# Patient Record
Sex: Female | Born: 1935 | Race: Black or African American | Hispanic: No | Marital: Married | Smoking: Never smoker
Health system: Southern US, Community
[De-identification: ages and names within clinical notes are randomized; demographics above are authoritative.]

## PROBLEM LIST (undated history)

## (undated) DIAGNOSIS — E119 Type 2 diabetes mellitus without complications: Secondary | ICD-10-CM

## (undated) DIAGNOSIS — C801 Malignant (primary) neoplasm, unspecified: Secondary | ICD-10-CM

## (undated) DIAGNOSIS — E785 Hyperlipidemia, unspecified: Secondary | ICD-10-CM

## (undated) HISTORY — PX: ABDOMINAL HYSTERECTOMY: SHX81

## (undated) HISTORY — PX: BREAST SURGERY: SHX581

---

## 1998-02-18 ENCOUNTER — Other Ambulatory Visit: Admission: RE | Admit: 1998-02-18 | Discharge: 1998-02-18 | Payer: Self-pay | Admitting: Nephrology

## 2001-01-17 ENCOUNTER — Encounter: Admission: RE | Admit: 2001-01-17 | Discharge: 2001-01-17 | Payer: Self-pay | Admitting: Nephrology

## 2001-01-17 ENCOUNTER — Encounter: Payer: Self-pay | Admitting: Nephrology

## 2001-05-30 ENCOUNTER — Other Ambulatory Visit: Admission: RE | Admit: 2001-05-30 | Discharge: 2001-05-30 | Payer: Self-pay | Admitting: Radiology

## 2001-06-14 ENCOUNTER — Encounter (HOSPITAL_BASED_OUTPATIENT_CLINIC_OR_DEPARTMENT_OTHER): Payer: Self-pay | Admitting: General Surgery

## 2001-06-16 ENCOUNTER — Ambulatory Visit (HOSPITAL_COMMUNITY): Admission: RE | Admit: 2001-06-16 | Discharge: 2001-06-16 | Payer: Self-pay | Admitting: General Surgery

## 2001-07-13 ENCOUNTER — Encounter: Admission: RE | Admit: 2001-07-13 | Discharge: 2001-07-13 | Payer: Self-pay | Admitting: Nephrology

## 2001-07-13 ENCOUNTER — Encounter: Payer: Self-pay | Admitting: Nephrology

## 2001-07-19 ENCOUNTER — Ambulatory Visit: Admission: RE | Admit: 2001-07-19 | Discharge: 2001-10-17 | Payer: Self-pay | Admitting: *Deleted

## 2001-12-21 ENCOUNTER — Encounter: Payer: Self-pay | Admitting: Nephrology

## 2001-12-21 ENCOUNTER — Ambulatory Visit (HOSPITAL_COMMUNITY): Admission: RE | Admit: 2001-12-21 | Discharge: 2001-12-21 | Payer: Self-pay | Admitting: Nephrology

## 2002-03-07 ENCOUNTER — Ambulatory Visit (HOSPITAL_COMMUNITY): Admission: RE | Admit: 2002-03-07 | Discharge: 2002-03-07 | Payer: Self-pay | Admitting: Nephrology

## 2002-03-07 ENCOUNTER — Encounter: Payer: Self-pay | Admitting: Nephrology

## 2002-03-08 ENCOUNTER — Other Ambulatory Visit: Admission: RE | Admit: 2002-03-08 | Discharge: 2002-03-08 | Payer: Self-pay | Admitting: Obstetrics and Gynecology

## 2003-09-17 ENCOUNTER — Encounter: Admission: RE | Admit: 2003-09-17 | Discharge: 2003-09-17 | Payer: Self-pay | Admitting: Nephrology

## 2004-07-30 ENCOUNTER — Other Ambulatory Visit: Admission: RE | Admit: 2004-07-30 | Discharge: 2004-07-30 | Payer: Self-pay | Admitting: Nephrology

## 2005-03-30 ENCOUNTER — Ambulatory Visit: Payer: Self-pay | Admitting: Oncology

## 2006-03-31 ENCOUNTER — Ambulatory Visit: Payer: Self-pay | Admitting: Oncology

## 2006-04-01 LAB — CBC WITH DIFFERENTIAL/PLATELET
BASO%: 0.4 % (ref 0.0–2.0)
Eosinophils Absolute: 0.2 10*3/uL (ref 0.0–0.5)
LYMPH%: 22.5 % (ref 14.0–48.0)
MCHC: 34.1 g/dL (ref 32.0–36.0)
MCV: 97.7 fL (ref 81.0–101.0)
MONO#: 0.5 10*3/uL (ref 0.1–0.9)
MONO%: 6.6 % (ref 0.0–13.0)
NEUT#: 4.8 10*3/uL (ref 1.5–6.5)
Platelets: 209 10*3/uL (ref 145–400)
RBC: 3.63 10*6/uL — ABNORMAL LOW (ref 3.70–5.32)
RDW: 12.8 % (ref 11.3–14.5)
WBC: 7.1 10*3/uL (ref 3.9–10.0)

## 2006-04-01 LAB — MORPHOLOGY

## 2006-04-05 LAB — COMPREHENSIVE METABOLIC PANEL
Alkaline Phosphatase: 43 U/L (ref 39–117)
CO2: 24 mEq/L (ref 19–32)
Creatinine, Ser: 1.02 mg/dL (ref 0.40–1.20)
Glucose, Bld: 145 mg/dL — ABNORMAL HIGH (ref 70–99)
Total Bilirubin: 1.1 mg/dL (ref 0.3–1.2)

## 2006-04-05 LAB — IMMUNOFIXATION ELECTROPHORESIS
IgA: 307 mg/dL (ref 68–378)
IgG (Immunoglobin G), Serum: 1420 mg/dL (ref 694–1618)
IgM, Serum: 63 mg/dL (ref 60–263)
Total Protein, Serum Electrophoresis: 7.4 g/dL (ref 6.0–8.3)

## 2006-04-05 LAB — LACTATE DEHYDROGENASE: LDH: 162 U/L (ref 94–250)

## 2006-07-12 ENCOUNTER — Other Ambulatory Visit: Admission: RE | Admit: 2006-07-12 | Discharge: 2006-07-12 | Payer: Self-pay | Admitting: Nephrology

## 2012-02-06 ENCOUNTER — Other Ambulatory Visit: Payer: Self-pay | Admitting: Nephrology

## 2014-09-09 DIAGNOSIS — E119 Type 2 diabetes mellitus without complications: Secondary | ICD-10-CM | POA: Diagnosis not present

## 2014-09-09 DIAGNOSIS — I10 Essential (primary) hypertension: Secondary | ICD-10-CM | POA: Diagnosis not present

## 2014-09-09 DIAGNOSIS — E780 Pure hypercholesterolemia: Secondary | ICD-10-CM | POA: Diagnosis not present

## 2014-09-10 DIAGNOSIS — I10 Essential (primary) hypertension: Secondary | ICD-10-CM | POA: Diagnosis not present

## 2014-09-10 DIAGNOSIS — E780 Pure hypercholesterolemia: Secondary | ICD-10-CM | POA: Diagnosis not present

## 2014-09-10 DIAGNOSIS — E119 Type 2 diabetes mellitus without complications: Secondary | ICD-10-CM | POA: Diagnosis not present

## 2014-09-19 DIAGNOSIS — E780 Pure hypercholesterolemia: Secondary | ICD-10-CM | POA: Diagnosis not present

## 2014-09-19 DIAGNOSIS — I10 Essential (primary) hypertension: Secondary | ICD-10-CM | POA: Diagnosis not present

## 2014-09-19 DIAGNOSIS — E119 Type 2 diabetes mellitus without complications: Secondary | ICD-10-CM | POA: Diagnosis not present

## 2014-09-30 DIAGNOSIS — E119 Type 2 diabetes mellitus without complications: Secondary | ICD-10-CM | POA: Diagnosis not present

## 2014-09-30 DIAGNOSIS — I10 Essential (primary) hypertension: Secondary | ICD-10-CM | POA: Diagnosis not present

## 2014-09-30 DIAGNOSIS — E780 Pure hypercholesterolemia: Secondary | ICD-10-CM | POA: Diagnosis not present

## 2014-10-14 DIAGNOSIS — H4011X3 Primary open-angle glaucoma, severe stage: Secondary | ICD-10-CM | POA: Diagnosis not present

## 2014-10-14 DIAGNOSIS — H02403 Unspecified ptosis of bilateral eyelids: Secondary | ICD-10-CM | POA: Diagnosis not present

## 2014-10-14 DIAGNOSIS — H2512 Age-related nuclear cataract, left eye: Secondary | ICD-10-CM | POA: Diagnosis not present

## 2014-10-21 DIAGNOSIS — Z1231 Encounter for screening mammogram for malignant neoplasm of breast: Secondary | ICD-10-CM | POA: Diagnosis not present

## 2015-01-08 DIAGNOSIS — E119 Type 2 diabetes mellitus without complications: Secondary | ICD-10-CM | POA: Diagnosis not present

## 2015-01-08 DIAGNOSIS — E780 Pure hypercholesterolemia: Secondary | ICD-10-CM | POA: Diagnosis not present

## 2015-01-08 DIAGNOSIS — I10 Essential (primary) hypertension: Secondary | ICD-10-CM | POA: Diagnosis not present

## 2015-02-27 DIAGNOSIS — I10 Essential (primary) hypertension: Secondary | ICD-10-CM | POA: Diagnosis not present

## 2015-02-27 DIAGNOSIS — E119 Type 2 diabetes mellitus without complications: Secondary | ICD-10-CM | POA: Diagnosis not present

## 2015-02-27 DIAGNOSIS — E780 Pure hypercholesterolemia: Secondary | ICD-10-CM | POA: Diagnosis not present

## 2015-02-27 DIAGNOSIS — M542 Cervicalgia: Secondary | ICD-10-CM | POA: Diagnosis not present

## 2015-03-24 DIAGNOSIS — H4011X3 Primary open-angle glaucoma, severe stage: Secondary | ICD-10-CM | POA: Diagnosis not present

## 2015-03-24 DIAGNOSIS — H02403 Unspecified ptosis of bilateral eyelids: Secondary | ICD-10-CM | POA: Diagnosis not present

## 2015-03-26 DIAGNOSIS — E780 Pure hypercholesterolemia: Secondary | ICD-10-CM | POA: Diagnosis not present

## 2015-03-26 DIAGNOSIS — I10 Essential (primary) hypertension: Secondary | ICD-10-CM | POA: Diagnosis not present

## 2015-03-26 DIAGNOSIS — E119 Type 2 diabetes mellitus without complications: Secondary | ICD-10-CM | POA: Diagnosis not present

## 2015-05-07 DIAGNOSIS — E781 Pure hyperglyceridemia: Secondary | ICD-10-CM | POA: Diagnosis not present

## 2015-05-07 DIAGNOSIS — I10 Essential (primary) hypertension: Secondary | ICD-10-CM | POA: Diagnosis not present

## 2015-05-07 DIAGNOSIS — E119 Type 2 diabetes mellitus without complications: Secondary | ICD-10-CM | POA: Diagnosis not present

## 2015-07-25 DIAGNOSIS — H401133 Primary open-angle glaucoma, bilateral, severe stage: Secondary | ICD-10-CM | POA: Diagnosis not present

## 2015-08-20 DIAGNOSIS — E119 Type 2 diabetes mellitus without complications: Secondary | ICD-10-CM | POA: Diagnosis not present

## 2015-08-20 DIAGNOSIS — E7800 Pure hypercholesterolemia, unspecified: Secondary | ICD-10-CM | POA: Diagnosis not present

## 2015-08-20 DIAGNOSIS — I10 Essential (primary) hypertension: Secondary | ICD-10-CM | POA: Diagnosis not present

## 2015-09-13 DIAGNOSIS — H401133 Primary open-angle glaucoma, bilateral, severe stage: Secondary | ICD-10-CM | POA: Diagnosis not present

## 2015-09-19 ENCOUNTER — Emergency Department (HOSPITAL_COMMUNITY)
Admission: EM | Admit: 2015-09-19 | Discharge: 2015-09-19 | Disposition: A | Discharge status: Home / Self Care | Payer: Medicare Other | Attending: Emergency Medicine | Admitting: Emergency Medicine

## 2015-09-19 ENCOUNTER — Emergency Department (HOSPITAL_COMMUNITY): Payer: Medicare Other

## 2015-09-19 ENCOUNTER — Encounter (HOSPITAL_COMMUNITY): Payer: Self-pay | Admitting: *Deleted

## 2015-09-19 DIAGNOSIS — S0993XA Unspecified injury of face, initial encounter: Secondary | ICD-10-CM | POA: Diagnosis present

## 2015-09-19 DIAGNOSIS — E119 Type 2 diabetes mellitus without complications: Secondary | ICD-10-CM | POA: Insufficient documentation

## 2015-09-19 DIAGNOSIS — Z79899 Other long term (current) drug therapy: Secondary | ICD-10-CM | POA: Insufficient documentation

## 2015-09-19 DIAGNOSIS — Y92481 Parking lot as the place of occurrence of the external cause: Secondary | ICD-10-CM | POA: Insufficient documentation

## 2015-09-19 DIAGNOSIS — Y998 Other external cause status: Secondary | ICD-10-CM | POA: Diagnosis not present

## 2015-09-19 DIAGNOSIS — Y9389 Activity, other specified: Secondary | ICD-10-CM | POA: Diagnosis not present

## 2015-09-19 DIAGNOSIS — S0011XA Contusion of right eyelid and periocular area, initial encounter: Secondary | ICD-10-CM | POA: Diagnosis not present

## 2015-09-19 DIAGNOSIS — Z7982 Long term (current) use of aspirin: Secondary | ICD-10-CM | POA: Insufficient documentation

## 2015-09-19 DIAGNOSIS — S0083XA Contusion of other part of head, initial encounter: Secondary | ICD-10-CM | POA: Insufficient documentation

## 2015-09-19 DIAGNOSIS — Z859 Personal history of malignant neoplasm, unspecified: Secondary | ICD-10-CM | POA: Diagnosis not present

## 2015-09-19 DIAGNOSIS — S0033XA Contusion of nose, initial encounter: Secondary | ICD-10-CM | POA: Diagnosis not present

## 2015-09-19 DIAGNOSIS — S0511XA Contusion of eyeball and orbital tissues, right eye, initial encounter: Secondary | ICD-10-CM | POA: Diagnosis not present

## 2015-09-19 DIAGNOSIS — E785 Hyperlipidemia, unspecified: Secondary | ICD-10-CM | POA: Insufficient documentation

## 2015-09-19 DIAGNOSIS — S0990XA Unspecified injury of head, initial encounter: Secondary | ICD-10-CM | POA: Diagnosis not present

## 2015-09-19 DIAGNOSIS — W01198A Fall on same level from slipping, tripping and stumbling with subsequent striking against other object, initial encounter: Secondary | ICD-10-CM | POA: Insufficient documentation

## 2015-09-19 DIAGNOSIS — S0512XA Contusion of eyeball and orbital tissues, left eye, initial encounter: Secondary | ICD-10-CM | POA: Diagnosis not present

## 2015-09-19 HISTORY — DX: Malignant (primary) neoplasm, unspecified: C80.1

## 2015-09-19 HISTORY — DX: Hyperlipidemia, unspecified: E78.5

## 2015-09-19 HISTORY — DX: Type 2 diabetes mellitus without complications: E11.9

## 2015-09-19 MED ORDER — ACETAMINOPHEN 325 MG PO TABS
650.0000 mg | ORAL_TABLET | Freq: Once | ORAL | Status: AC
  Administered 2015-09-19: 650 mg via ORAL
  Filled 2015-09-19: qty 2

## 2015-09-19 MED ORDER — MUPIROCIN CALCIUM 2 % EX CREA: 1.0000 "application " | TOPICAL_CREAM | Freq: Two times a day (BID) | CUTANEOUS | Status: DC

## 2015-09-19 NOTE — ED Notes (Signed)
Pt tripped over parking curb Monday, hit face against sign, had swelling to face, now has bruising to both eyes, No LOC at time. Spoke with PMD and was told to come to ED.

## 2015-09-19 NOTE — ED Provider Notes (Signed)
CSN: [id number]     Arrival date & time 09/19/15  1121 History   First MD Initiated Contact with Patient 09/19/15 1252     Chief Complaint  Patient presents with  . Facial Injury    Patient is a 82 y.o. female presenting with facial injury.  Facial Injury   Joann Anderson is an 82 y.o. female who presents to the ED for evaluation of of bruising of her face after a facial injury. She states that four days ago she tripped over the curb in the parking lot and hit her right forehead on a parking sign. She states that initially she had a "knot" above her right eyebrow. She states that the knot has gone down but over the past couple of days she has developed significant bruising around her right eye and it is starting to spread across the bridge of her nose to her left eye. She denies any headache, dizziness, nausea/vomiting. Denies visual disturbance or pain with eye movement. She states she has been taking ibuprofen as needed for pain that is localized more to the abrasion site. Denies anticoagulation use. Otherwise has no complaints. Denies falling or LOC at time of injury.   Past Medical History  Diagnosis Date  . Diabetes mellitus without complication ([place])   . Hyperlipemia   . Cancer [facility])    Past Surgical History  Procedure Laterality Date  . Breast surgery    . Abdominal hysterectomy     No family history on file. Social History  Substance Use Topics  . Smoking status: Never Smoker   . Smokeless tobacco: None  . Alcohol Use: No   OB History    No data available     Review of Systems  All other systems reviewed and are negative.     Allergies  Aspirin; Quinine; and Sulfamethoxazole  Home Medications   Prior to Admission medications   Medication Sig Start Date End Date Taking? Authorizing Provider  amLODipine (NORVASC) 10 MG tablet Take 10 mg by mouth daily. 07/19/12  Yes Historical Provider, MD  aspirin 81 MG tablet Take 81 mg by mouth daily.   Yes Historical Provider, MD   atorvastatin (LIPITOR) 20 MG tablet Take 20 mg by mouth daily. 12/19/12  Yes Historical Provider, MD  bimatoprost (LUMIGAN) 0.01 % SOLN Place 1 drop into both eyes at bedtime. 03/24/15  Yes Historical Provider, MD  brimonidine (ALPHAGAN P) 0.1 % SOLN Place 1 drop into both eyes 2 (two) times daily. 03/24/15  Yes Historical Provider, MD  dorzolamide-timolol (COSOPT) 22.3-6.8 MG/ML ophthalmic solution Place 1 drop into both eyes 2 (two) times daily. 09/16/15  Yes Historical Provider, MD  HUMULIN N 100 UNIT/ML injection Inject 10-30 Units into the skin 2 (two) times daily. 30 units every morning and 10 units every evening 08/22/15  Yes Historical Provider, MD  pregabalin (LYRICA) 25 MG capsule Take 25 mg by mouth daily. 08/03/12  Yes Historical Provider, MD  valsartan-hydrochlorothiazide (DIOVAN-HCT) 160-12.5 MG tablet Take 1 tablet by mouth daily. 09/01/15  Yes Historical Provider, MD   BP 162/70 mmHg  Pulse 76  Temp(Src) 98.1 F (36.7 C) (Oral)  Resp 18  SpO2 97% Physical Exam  Constitutional: She is oriented to person, place, and time. No distress.  HENT:  Head:    Right Ear: External ear normal.  Left Ear: External ear normal.  Nose: Nose normal.  No crepitus or instability of facial bones. No other facial tenderness.   Eyes: Conjunctivae are normal. No scleral icterus.  Diffuse ecchymosis right periorbital area. There is some bruising that extends across bridge of nose to the inner corner of left periorbital area.   Right eye with some conjunctival hemorrhage to lateral sclera.   EOM intact and nonpainful bilaterally  Neck: Normal range of motion. Neck supple.  Cardiovascular: Normal rate and regular rhythm.   Pulmonary/Chest: Effort normal. No respiratory distress. She exhibits no tenderness.  Abdominal: Soft. She exhibits no distension. There is no tenderness.  Neurological: She is alert and oriented to person, place, and time. No cranial nerve deficit. Coordination and gait normal.    Skin: Skin is warm and dry. She is not diaphoretic.  Psychiatric: She has a normal mood and affect. Her behavior is normal.  Nursing note and vitals reviewed.   ED Course  Procedures (including critical care time) Labs Review Labs Reviewed - No data to display  Imaging Review Ct Head Wo Contrast  09/19/2015  CLINICAL DATA:  Tripped over a curb on Monday striking face against a sign, facial swelling, bruised in both eyes greater on RIGHT, diabetes mellitus EXAM: CT HEAD WITHOUT CONTRAST CT MAXILLOFACIAL WITHOUT CONTRAST TECHNIQUE: Multidetector CT imaging of the head and maxillofacial structures were performed using the standard protocol without intravenous contrast. Multiplanar CT image reconstructions of the maxillofacial structures were also generated. Right side of face marked with BB. COMPARISON:  None. FINDINGS: CT HEAD FINDINGS Generalized atrophy. Slight prominent ventricular system likely related atrophy. Cavum septum pellucidum and vergae. No midline shift or mass effect. Normal appearance of brain parenchyma. No intracranial hemorrhage, mass lesion, or evidence acute infarction. No extra-axial fluid collections. Atherosclerotic calcifications at the carotid siphons. Calvaria intact. CT MAXILLOFACIAL FINDINGS Intraorbital soft tissue planes clear. Small RIGHT supraorbital scalp hematoma. Paranasal sinuses, mastoid air cells, and middle ear cavities clear. Hyperostosis frontalis interna. No facial bone fractures identified. Visualized portion of cervical spine appears grossly intact. Denture plates noted. IMPRESSION: No acute intracranial abnormalities. No acute facial bone abnormalities. Electronically Signed   By: Lavonia Dana M.D.   On: 09/19/2015 14:32   Ct Maxillofacial Wo Cm  09/19/2015  CLINICAL DATA:  Tripped over a curb on Monday striking face against a sign, facial swelling, bruised in both eyes greater on RIGHT, diabetes mellitus EXAM: CT HEAD WITHOUT CONTRAST CT MAXILLOFACIAL  WITHOUT CONTRAST TECHNIQUE: Multidetector CT imaging of the head and maxillofacial structures were performed using the standard protocol without intravenous contrast. Multiplanar CT image reconstructions of the maxillofacial structures were also generated. Right side of face marked with BB. COMPARISON:  None. FINDINGS: CT HEAD FINDINGS Generalized atrophy. Slight prominent ventricular system likely related atrophy. Cavum septum pellucidum and vergae. No midline shift or mass effect. Normal appearance of brain parenchyma. No intracranial hemorrhage, mass lesion, or evidence acute infarction. No extra-axial fluid collections. Atherosclerotic calcifications at the carotid siphons. Calvaria intact. CT MAXILLOFACIAL FINDINGS Intraorbital soft tissue planes clear. Small RIGHT supraorbital scalp hematoma. Paranasal sinuses, mastoid air cells, and middle ear cavities clear. Hyperostosis frontalis interna. No facial bone fractures identified. Visualized portion of cervical spine appears grossly intact. Denture plates noted. IMPRESSION: No acute intracranial abnormalities. No acute facial bone abnormalities. Electronically Signed   By: Lavonia Dana M.D.   On: 09/19/2015 14:32   I have personally reviewed and evaluated these images and lab results as part of my medical decision-making.   EKG Interpretation None      MDM   Final diagnoses:  Contusion of face, initial encounter    CT negative. Pain resolved with tylenol. Will  give rx for bactroban for small abrasion on forehead. Pt may continue tylenol as needed for pain. Instructed to f/u with PCP. ER return precautions given.    Anne Ng, PA-C 09/19/15 1633  Carmin Muskrat, MD 09/22/15 [id number]

## 2015-09-19 NOTE — ED Notes (Signed)
Ambulatory

## 2015-09-19 NOTE — Discharge Instructions (Signed)
Your CT scans were normal today. Your bruises will take time to heal on their own. I will give you a prescription for antibiotic cream to put on the abrasion on your forehead as needed. Please follow up with your primary care provider within one week. Return to the ER for new or worsening symptoms.   Facial or Scalp Contusion A facial or scalp contusion is a deep bruise on the face or head. Injuries to the face and head generally cause a lot of swelling, especially around the eyes. Contusions are the result of an injury that caused bleeding under the skin. The contusion may turn blue, purple, or yellow. Minor injuries will give you a painless contusion, but more severe contusions may stay painful and swollen for a few weeks.  CAUSES  A facial or scalp contusion is caused by a blunt injury or trauma to the face or head area.  SIGNS AND SYMPTOMS   Swelling of the injured area.   Discoloration of the injured area.   Tenderness, soreness, or pain in the injured area.  DIAGNOSIS  The diagnosis can be made by taking a medical history and doing a physical exam. An X-ray exam, CT scan, or MRI may be needed to determine if there are any associated injuries, such as broken bones (fractures). TREATMENT  Often, the best treatment for a facial or scalp contusion is applying cold compresses to the injured area. Over-the-counter medicines may also be recommended for pain control.  HOME CARE INSTRUCTIONS   Only take over-the-counter or prescription medicines as directed by your health care provider.   Apply ice to the injured area.   Put ice in a plastic bag.   Place a towel between your skin and the bag.   Leave the ice on for 20 minutes, 2-3 times a day.  SEEK MEDICAL CARE IF:  You have bite problems.   You have pain with chewing.   You are concerned about facial defects. SEEK IMMEDIATE MEDICAL CARE IF:  You have severe pain or a headache that is not relieved by medicine.   You  have unusual sleepiness, confusion, or personality changes.   You throw up (vomit).   You have a persistent nosebleed.   You have double vision or blurred vision.   You have fluid drainage from your nose or ear.   You have difficulty walking or using your arms or legs.  MAKE SURE YOU:   Understand these instructions.  Will watch your condition.  Will get help right away if you are not doing well or get worse.   This information is not intended to replace advice given to you by your health care provider. Make sure you discuss any questions you have with your health care provider.   Document Released: 07/29/2004 Document Revised: 07/12/2014 Document Reviewed: 02/01/2013 Elsevier Interactive Patient Education [employer].

## 2015-09-24 DIAGNOSIS — S0083XA Contusion of other part of head, initial encounter: Secondary | ICD-10-CM | POA: Diagnosis not present

## 2015-09-24 DIAGNOSIS — E119 Type 2 diabetes mellitus without complications: Secondary | ICD-10-CM | POA: Diagnosis not present

## 2015-09-24 DIAGNOSIS — I10 Essential (primary) hypertension: Secondary | ICD-10-CM | POA: Diagnosis not present

## 2015-09-24 DIAGNOSIS — E7800 Pure hypercholesterolemia, unspecified: Secondary | ICD-10-CM | POA: Diagnosis not present

## 2015-10-15 DIAGNOSIS — J309 Allergic rhinitis, unspecified: Secondary | ICD-10-CM | POA: Diagnosis not present

## 2015-10-15 DIAGNOSIS — E119 Type 2 diabetes mellitus without complications: Secondary | ICD-10-CM | POA: Diagnosis not present

## 2015-10-22 DIAGNOSIS — Z1231 Encounter for screening mammogram for malignant neoplasm of breast: Secondary | ICD-10-CM | POA: Diagnosis not present

## 2015-10-22 DIAGNOSIS — Z853 Personal history of malignant neoplasm of breast: Secondary | ICD-10-CM | POA: Diagnosis not present

## 2015-12-17 DIAGNOSIS — M179 Osteoarthritis of knee, unspecified: Secondary | ICD-10-CM | POA: Diagnosis not present

## 2015-12-17 DIAGNOSIS — E119 Type 2 diabetes mellitus without complications: Secondary | ICD-10-CM | POA: Diagnosis not present

## 2015-12-17 DIAGNOSIS — E7800 Pure hypercholesterolemia, unspecified: Secondary | ICD-10-CM | POA: Diagnosis not present

## 2015-12-17 DIAGNOSIS — I10 Essential (primary) hypertension: Secondary | ICD-10-CM | POA: Diagnosis not present

## 2016-04-05 DIAGNOSIS — H401133 Primary open-angle glaucoma, bilateral, severe stage: Secondary | ICD-10-CM | POA: Diagnosis not present

## 2016-04-14 DIAGNOSIS — I10 Essential (primary) hypertension: Secondary | ICD-10-CM | POA: Diagnosis not present

## 2016-04-14 DIAGNOSIS — E119 Type 2 diabetes mellitus without complications: Secondary | ICD-10-CM | POA: Diagnosis not present

## 2016-04-14 DIAGNOSIS — M2550 Pain in unspecified joint: Secondary | ICD-10-CM | POA: Diagnosis not present

## 2016-04-14 DIAGNOSIS — E7800 Pure hypercholesterolemia, unspecified: Secondary | ICD-10-CM | POA: Diagnosis not present

## 2016-04-14 DIAGNOSIS — E559 Vitamin D deficiency, unspecified: Secondary | ICD-10-CM | POA: Diagnosis not present

## 2016-04-14 DIAGNOSIS — Z23 Encounter for immunization: Secondary | ICD-10-CM | POA: Diagnosis not present

## 2016-06-21 DIAGNOSIS — H401133 Primary open-angle glaucoma, bilateral, severe stage: Secondary | ICD-10-CM | POA: Diagnosis not present

## 2017-02-21 ENCOUNTER — Emergency Department (HOSPITAL_COMMUNITY): Payer: Medicare Other

## 2017-02-21 ENCOUNTER — Emergency Department (HOSPITAL_COMMUNITY)
Admission: EM | Admit: 2017-02-21 | Discharge: 2017-02-21 | Disposition: A | Discharge status: Home / Self Care | Payer: Medicare Other | Attending: Emergency Medicine | Admitting: Emergency Medicine

## 2017-02-21 DIAGNOSIS — Y9301 Activity, walking, marching and hiking: Secondary | ICD-10-CM | POA: Insufficient documentation

## 2017-02-21 DIAGNOSIS — Z794 Long term (current) use of insulin: Secondary | ICD-10-CM | POA: Diagnosis not present

## 2017-02-21 DIAGNOSIS — E119 Type 2 diabetes mellitus without complications: Secondary | ICD-10-CM | POA: Insufficient documentation

## 2017-02-21 DIAGNOSIS — Z859 Personal history of malignant neoplasm, unspecified: Secondary | ICD-10-CM | POA: Diagnosis not present

## 2017-02-21 DIAGNOSIS — Y999 Unspecified external cause status: Secondary | ICD-10-CM | POA: Diagnosis not present

## 2017-02-21 DIAGNOSIS — M25561 Pain in right knee: Secondary | ICD-10-CM | POA: Diagnosis present

## 2017-02-21 DIAGNOSIS — W19XXXA Unspecified fall, initial encounter: Secondary | ICD-10-CM

## 2017-02-21 DIAGNOSIS — Z7982 Long term (current) use of aspirin: Secondary | ICD-10-CM | POA: Insufficient documentation

## 2017-02-21 DIAGNOSIS — W109XXA Fall (on) (from) unspecified stairs and steps, initial encounter: Secondary | ICD-10-CM | POA: Diagnosis not present

## 2017-02-21 DIAGNOSIS — Z79899 Other long term (current) drug therapy: Secondary | ICD-10-CM | POA: Diagnosis not present

## 2017-02-21 DIAGNOSIS — Y929 Unspecified place or not applicable: Secondary | ICD-10-CM | POA: Diagnosis not present

## 2017-02-21 MED ORDER — TRAMADOL HCL 50 MG PO TABS: 50.0000 mg | ORAL_TABLET | Freq: Four times a day (QID) | ORAL | 0 refills | Status: DC | PRN

## 2017-02-21 MED ORDER — TRAMADOL HCL 50 MG PO TABS
50.0000 mg | ORAL_TABLET | Freq: Once | ORAL | Status: AC
  Administered 2017-02-21: 50 mg via ORAL
  Filled 2017-02-21: qty 1

## 2017-02-21 NOTE — ED Provider Notes (Signed)
[facility] DEPT Provider Note   CSN: [id number] Arrival date & time: 02/21/17  1048     History   Chief Complaint Chief Complaint  Patient presents with  . Knee Pain    HPI Joann Anderson is a 83 y.o. female.  The history is provided by the patient, the spouse and medical records.  Knee Pain   This is a new problem. The current episode started more than 2 days ago. The problem occurs constantly. The problem has not changed since onset.The pain is present in the right knee. The quality of the pain is described as aching. The pain is at a severity of 5/10. The pain is moderate. Pertinent negatives include no numbness, full range of motion, no stiffness and no tingling. The symptoms are aggravated by standing. She has tried nothing for the symptoms. The treatment provided no relief. There has been a history of trauma. Family history is significant for no gout.    Past Medical History:  Diagnosis Date  . Cancer ([place])   . Diabetes mellitus without complication ([place])   . Hyperlipemia     There are no active problems to display for this patient.   Past Surgical History:  Procedure Laterality Date  . ABDOMINAL HYSTERECTOMY    . BREAST SURGERY      OB History    No data available       Home Medications    Prior to Admission medications   Medication Sig Start Date End Date Taking? Authorizing Provider  amLODipine (NORVASC) 10 MG tablet Take 10 mg by mouth daily. 07/19/12  Yes [provider]  aspirin 81 MG tablet Take 81 mg by mouth daily.   Yes [provider]  atorvastatin (LIPITOR) 20 MG tablet Take 20 mg by mouth daily. 12/19/12  Yes [provider]  bimatoprost (LUMIGAN) 0.01 % SOLN Place 1 drop into both eyes at bedtime. 03/24/15  Yes [provider]  brimonidine (ALPHAGAN P) 0.1 % SOLN Place 1 drop into both eyes 2 (two) times daily. 03/24/15  Yes [provider]  dorzolamide-timolol (COSOPT) 22.3-6.8 MG/ML ophthalmic  solution Place 1 drop into both eyes 2 (two) times daily. 09/16/15  Yes [provider]  HUMULIN N 100 UNIT/ML injection Inject 10-30 Units into the skin 2 (two) times daily. 30 units every morning and 10 units every evening 08/22/15  Yes [provider]  pregabalin (LYRICA) 25 MG capsule Take 25 mg by mouth daily as needed (pain).  08/03/12  Yes [provider]  timolol (TIMOPTIC) 0.5 % ophthalmic solution Place 1 drop into both eyes at bedtime.  01/31/17  Yes [provider]  valsartan-hydrochlorothiazide (DIOVAN-HCT) 160-12.5 MG tablet Take 1 tablet by mouth daily. 09/01/15  Yes [provider]  mupirocin cream (BACTROBAN) 2 % Apply 1 application topically 2 (two) times daily. Patient not taking: Reported on 02/21/2017 09/19/15   Anne Ng, PA-C    Family History No family history on file.  Social History Social History  Substance Use Topics  . Smoking status: Never Smoker  . Smokeless tobacco: Not on file  . Alcohol use No     Allergies   Aspirin; Quinine; and Sulfamethoxazole   Review of Systems Review of Systems  Constitutional: Negative for chills, diaphoresis, fatigue and fever.  HENT: Negative for congestion.   Respiratory: Negative for cough, chest tightness, shortness of breath, wheezing and stridor.   Cardiovascular: Negative for chest pain, palpitations and leg swelling.  Gastrointestinal: Negative for abdominal distention,  abdominal pain, constipation, diarrhea, nausea and vomiting.  Genitourinary: Negative for flank pain.  Musculoskeletal: Negative for back pain, neck pain, neck stiffness and stiffness.  Skin: Negative for rash and wound.  Neurological: Negative for tingling, light-headedness, numbness and headaches.  Psychiatric/Behavioral: Negative for agitation and confusion.  All other systems reviewed and are negative.    Physical Exam Updated Vital Signs BP (!) 147/70 (BP Location: Left Arm)   Pulse 78   Temp  98.3 F (36.8 C) (Oral)   Resp 15   SpO2 97%   Physical Exam  Constitutional: She is oriented to person, place, and time. She appears well-developed and well-nourished. No distress.  HENT:  Head: Normocephalic.  Mouth/Throat: Oropharynx is clear and moist. No oropharyngeal exudate.  Eyes: Pupils are equal, round, and reactive to light. Conjunctivae and EOM are normal.  Cardiovascular: Normal rate and intact distal pulses.   No murmur heard. Pulmonary/Chest: Effort normal. No stridor. She has no wheezes. She exhibits no tenderness.  Abdominal: Soft. She exhibits no distension. There is no tenderness.  Musculoskeletal: She exhibits tenderness. She exhibits no edema or deformity.       Right knee: She exhibits swelling. She exhibits normal range of motion, no laceration, no erythema, normal patellar mobility and no bony tenderness. Tenderness found. Medial joint line tenderness noted. No lateral joint line, no MCL and no patellar tendon tenderness noted.       Legs: Neurological: She is alert and oriented to person, place, and time. No sensory deficit. She exhibits normal muscle tone.  Skin: Capillary refill takes less than 2 seconds. No rash noted. She is not diaphoretic. No erythema.  Psychiatric: She has a normal mood and affect.  Nursing note and vitals reviewed.    ED Treatments / Results  Labs (all labs ordered are listed, but only abnormal results are displayed) Labs Reviewed - No data to display  EKG  EKG Interpretation None       Radiology Dg Knee Complete 4 Views Right  Result Date: 02/21/2017 CLINICAL DATA:  Fall, right knee pain EXAM: RIGHT KNEE - COMPLETE 4+ VIEW COMPARISON:  None. FINDINGS: Moderate to advanced tricompartment degenerative changes with joint space narrowing and spurring. Chondrocalcinosis. Moderate joint effusion. No acute bony abnormality. Specifically, no fracture, subluxation, or dislocation. Soft tissues are intact. IMPRESSION: Chondrocalcinosis  with moderate advanced osteoarthritis. Moderate joint effusion. Electronically Signed   By: Rolm Baptise M.D.   On: 02/21/2017 11:52    Procedures Procedures (including critical care time)  Medications Ordered in ED Medications  traMADol (ULTRAM) tablet 50 mg (50 mg Oral Given 02/21/17 1332)     Initial Impression / Assessment and Plan / ED Course  I have reviewed the triage vital signs and the nursing notes.  Pertinent labs & imaging results that were available during my care of the patient were reviewed by me and considered in my medical decision making (see chart for details).     Joann Anderson is a 83 y.o. female with past medical history significant for diabetes and hyperlipidemia who presents with right knee pain. Patient reports that she fell one week ago going up some stairs outside. She reports hitting the medial right knee onto the ground. She denied hitting any other locations and no headache or neck pain. She reports the pain is moderate to severe when she tries to walk. She denies any numbness, tingling, or weakness distally to the injury. She denies any history of surgery on the knee. She denies any skin change  or fevers or chills. No overlying infection. She says that she has taken Tylenol without significant pain relief. She denies any history of blood clots.  On exam, patient has some mild tenderness to the medial anterior right knee. No pain with knee range of motion. No significant swelling or erythema. No bruising. Normal sensation and pulses distally. No hip tenderness, abdominal tenderness, or chest tenderness. No other abnormalities on exam.  Pictures were obtained showing no evidence of fracture or dislocation. Osteoarthritis was appreciated.  Suspect soft tissue injury to the knee where she hit the ground. Patient will given dose of tramadol and will be given a prescription to go home with. Patient will be placed into a knee sleeve to help with compression. Patient  instructed to follow-up with PCP in the next several days as well as return precautions for any new or worsened symptoms. Patient understands to be careful not to fall.  Patient has no other questions or concerns and was discharged in good condition.           Final Clinical Impressions(s) / ED Diagnoses   Final diagnoses:  Acute pain of right knee  Fall, initial encounter    New Prescriptions New Prescriptions   TRAMADOL (ULTRAM) 50 MG TABLET    Take 1 tablet (50 mg total) by mouth every 6 (six) hours as needed.   Clinical Impression: 1. Acute pain of right knee   2. Fall, initial encounter     Disposition: Discharge  Condition: Good  I have discussed the results, Dx and Tx plan with the pt(& family if present). He/she/they expressed understanding and agree(s) with the plan. Discharge instructions discussed at great length. Strict return precautions discussed and pt &/or family have verbalized understanding of the instructions. No further questions at time of discharge.    New Prescriptions   TRAMADOL (ULTRAM) 50 MG TABLET    Take 1 tablet (50 mg total) by mouth every 6 (six) hours as needed.    Follow Up: [facility] DEPT [id number] [place] [address] [phone]  If symptoms worsen     Tegeler, Gwenyth Allegra, MD 02/21/17 2050

## 2017-02-21 NOTE — Discharge Instructions (Signed)
We suspect to have a musculoskeletal pain from your fall in your knee. Your images today show no evidence of fracture or dislocation. I do not feel you have an infection in your knee. Please use the pain medicine to help with your symptoms and use the rest, ice, compression, and elevation strategy we discussed. Please be careful not to fall again. Please follow-up with your primary care physician for reassessment. If any symptoms change or worsen, please return to the nearest emergency department.

## 2017-02-21 NOTE — ED Triage Notes (Signed)
Pt states that she fell approx. A week ago going up the stairs and hit her R knee. Swelling noted. Alert and oriented.

## 2017-10-03 DIAGNOSIS — H401133 Primary open-angle glaucoma, bilateral, severe stage: Secondary | ICD-10-CM | POA: Diagnosis not present

## 2017-10-24 DIAGNOSIS — Z853 Personal history of malignant neoplasm of breast: Secondary | ICD-10-CM | POA: Diagnosis not present

## 2017-10-24 DIAGNOSIS — Z1231 Encounter for screening mammogram for malignant neoplasm of breast: Secondary | ICD-10-CM | POA: Diagnosis not present

## 2017-10-27 DIAGNOSIS — N6002 Solitary cyst of left breast: Secondary | ICD-10-CM | POA: Diagnosis not present

## 2017-10-27 DIAGNOSIS — Z853 Personal history of malignant neoplasm of breast: Secondary | ICD-10-CM | POA: Diagnosis not present

## 2017-10-27 DIAGNOSIS — R928 Other abnormal and inconclusive findings on diagnostic imaging of breast: Secondary | ICD-10-CM | POA: Diagnosis not present

## 2017-11-09 DIAGNOSIS — M25561 Pain in right knee: Secondary | ICD-10-CM | POA: Diagnosis not present

## 2018-01-02 DIAGNOSIS — H401133 Primary open-angle glaucoma, bilateral, severe stage: Secondary | ICD-10-CM | POA: Diagnosis not present

## 2018-01-11 DIAGNOSIS — Z794 Long term (current) use of insulin: Secondary | ICD-10-CM | POA: Diagnosis not present

## 2018-01-11 DIAGNOSIS — E1165 Type 2 diabetes mellitus with hyperglycemia: Secondary | ICD-10-CM | POA: Diagnosis not present

## 2018-01-11 DIAGNOSIS — E1142 Type 2 diabetes mellitus with diabetic polyneuropathy: Secondary | ICD-10-CM | POA: Diagnosis not present

## 2018-01-11 DIAGNOSIS — I10 Essential (primary) hypertension: Secondary | ICD-10-CM | POA: Diagnosis not present

## 2018-01-11 DIAGNOSIS — E782 Mixed hyperlipidemia: Secondary | ICD-10-CM | POA: Diagnosis not present

## 2018-03-22 DIAGNOSIS — E782 Mixed hyperlipidemia: Secondary | ICD-10-CM | POA: Diagnosis not present

## 2018-03-22 DIAGNOSIS — Z0000 Encounter for general adult medical examination without abnormal findings: Secondary | ICD-10-CM | POA: Diagnosis not present

## 2018-03-22 DIAGNOSIS — I10 Essential (primary) hypertension: Secondary | ICD-10-CM | POA: Diagnosis not present

## 2018-03-22 DIAGNOSIS — E1165 Type 2 diabetes mellitus with hyperglycemia: Secondary | ICD-10-CM | POA: Diagnosis not present

## 2018-03-31 DIAGNOSIS — E1165 Type 2 diabetes mellitus with hyperglycemia: Secondary | ICD-10-CM | POA: Diagnosis not present

## 2018-03-31 DIAGNOSIS — Z0000 Encounter for general adult medical examination without abnormal findings: Secondary | ICD-10-CM | POA: Diagnosis not present

## 2018-03-31 DIAGNOSIS — Z23 Encounter for immunization: Secondary | ICD-10-CM | POA: Diagnosis not present

## 2018-03-31 DIAGNOSIS — E782 Mixed hyperlipidemia: Secondary | ICD-10-CM | POA: Diagnosis not present

## 2018-03-31 DIAGNOSIS — Z794 Long term (current) use of insulin: Secondary | ICD-10-CM | POA: Diagnosis not present

## 2018-04-17 ENCOUNTER — Other Ambulatory Visit: Payer: Self-pay | Admitting: Internal Medicine

## 2018-04-21 DIAGNOSIS — E1165 Type 2 diabetes mellitus with hyperglycemia: Secondary | ICD-10-CM | POA: Diagnosis not present

## 2018-04-21 DIAGNOSIS — E782 Mixed hyperlipidemia: Secondary | ICD-10-CM | POA: Diagnosis not present

## 2018-04-21 DIAGNOSIS — E1142 Type 2 diabetes mellitus with diabetic polyneuropathy: Secondary | ICD-10-CM | POA: Diagnosis not present

## 2018-04-21 DIAGNOSIS — I10 Essential (primary) hypertension: Secondary | ICD-10-CM | POA: Diagnosis not present

## 2018-05-31 DIAGNOSIS — H401133 Primary open-angle glaucoma, bilateral, severe stage: Secondary | ICD-10-CM | POA: Diagnosis not present

## 2018-06-12 DIAGNOSIS — Z0110 Encounter for examination of ears and hearing without abnormal findings: Secondary | ICD-10-CM | POA: Diagnosis not present

## 2018-06-12 DIAGNOSIS — Z131 Encounter for screening for diabetes mellitus: Secondary | ICD-10-CM | POA: Diagnosis not present

## 2018-06-12 DIAGNOSIS — Z0000 Encounter for general adult medical examination without abnormal findings: Secondary | ICD-10-CM | POA: Diagnosis not present

## 2018-06-12 DIAGNOSIS — Z0100 Encounter for examination of eyes and vision without abnormal findings: Secondary | ICD-10-CM | POA: Diagnosis not present

## 2018-06-12 DIAGNOSIS — I10 Essential (primary) hypertension: Secondary | ICD-10-CM | POA: Diagnosis not present

## 2018-06-12 DIAGNOSIS — Z136 Encounter for screening for cardiovascular disorders: Secondary | ICD-10-CM | POA: Diagnosis not present

## 2018-06-16 DIAGNOSIS — M1711 Unilateral primary osteoarthritis, right knee: Secondary | ICD-10-CM | POA: Diagnosis not present

## 2018-07-25 DIAGNOSIS — I10 Essential (primary) hypertension: Secondary | ICD-10-CM | POA: Diagnosis not present

## 2018-07-25 DIAGNOSIS — R609 Edema, unspecified: Secondary | ICD-10-CM | POA: Diagnosis not present

## 2018-07-25 DIAGNOSIS — Z794 Long term (current) use of insulin: Secondary | ICD-10-CM | POA: Diagnosis not present

## 2018-07-25 DIAGNOSIS — E7800 Pure hypercholesterolemia, unspecified: Secondary | ICD-10-CM | POA: Diagnosis not present

## 2018-07-25 DIAGNOSIS — E1165 Type 2 diabetes mellitus with hyperglycemia: Secondary | ICD-10-CM | POA: Diagnosis not present

## 2018-07-28 DIAGNOSIS — I10 Essential (primary) hypertension: Secondary | ICD-10-CM | POA: Diagnosis not present

## 2018-09-07 DIAGNOSIS — I10 Essential (primary) hypertension: Secondary | ICD-10-CM | POA: Diagnosis not present

## 2018-09-07 DIAGNOSIS — E7800 Pure hypercholesterolemia, unspecified: Secondary | ICD-10-CM | POA: Diagnosis not present

## 2018-09-07 DIAGNOSIS — G4709 Other insomnia: Secondary | ICD-10-CM | POA: Diagnosis not present

## 2018-09-07 DIAGNOSIS — E1165 Type 2 diabetes mellitus with hyperglycemia: Secondary | ICD-10-CM | POA: Diagnosis not present

## 2018-09-26 DIAGNOSIS — R609 Edema, unspecified: Secondary | ICD-10-CM | POA: Diagnosis not present

## 2018-09-26 DIAGNOSIS — I10 Essential (primary) hypertension: Secondary | ICD-10-CM | POA: Diagnosis not present

## 2018-09-26 DIAGNOSIS — E1165 Type 2 diabetes mellitus with hyperglycemia: Secondary | ICD-10-CM | POA: Diagnosis not present

## 2018-09-26 DIAGNOSIS — Z794 Long term (current) use of insulin: Secondary | ICD-10-CM | POA: Diagnosis not present

## 2018-09-26 DIAGNOSIS — E7800 Pure hypercholesterolemia, unspecified: Secondary | ICD-10-CM | POA: Diagnosis not present

## 2018-11-20 DIAGNOSIS — H401133 Primary open-angle glaucoma, bilateral, severe stage: Secondary | ICD-10-CM | POA: Diagnosis not present

## 2018-12-15 DIAGNOSIS — Z853 Personal history of malignant neoplasm of breast: Secondary | ICD-10-CM | POA: Diagnosis not present

## 2018-12-15 DIAGNOSIS — Z1231 Encounter for screening mammogram for malignant neoplasm of breast: Secondary | ICD-10-CM | POA: Diagnosis not present

## 2018-12-19 DIAGNOSIS — E1165 Type 2 diabetes mellitus with hyperglycemia: Secondary | ICD-10-CM | POA: Diagnosis not present

## 2018-12-19 DIAGNOSIS — I10 Essential (primary) hypertension: Secondary | ICD-10-CM | POA: Diagnosis not present

## 2018-12-19 DIAGNOSIS — Z794 Long term (current) use of insulin: Secondary | ICD-10-CM | POA: Diagnosis not present

## 2018-12-19 DIAGNOSIS — E7800 Pure hypercholesterolemia, unspecified: Secondary | ICD-10-CM | POA: Diagnosis not present

## 2018-12-19 DIAGNOSIS — R609 Edema, unspecified: Secondary | ICD-10-CM | POA: Diagnosis not present

## 2018-12-27 DIAGNOSIS — H401133 Primary open-angle glaucoma, bilateral, severe stage: Secondary | ICD-10-CM | POA: Diagnosis not present

## 2019-01-26 DIAGNOSIS — Z7189 Other specified counseling: Secondary | ICD-10-CM | POA: Diagnosis not present

## 2019-01-26 DIAGNOSIS — Z03818 Encounter for observation for suspected exposure to other biological agents ruled out: Secondary | ICD-10-CM | POA: Diagnosis not present

## 2019-01-30 DIAGNOSIS — E1165 Type 2 diabetes mellitus with hyperglycemia: Secondary | ICD-10-CM | POA: Diagnosis not present

## 2019-01-30 DIAGNOSIS — Z794 Long term (current) use of insulin: Secondary | ICD-10-CM | POA: Diagnosis not present

## 2019-01-30 DIAGNOSIS — I10 Essential (primary) hypertension: Secondary | ICD-10-CM | POA: Diagnosis not present

## 2019-01-30 DIAGNOSIS — Z0001 Encounter for general adult medical examination with abnormal findings: Secondary | ICD-10-CM | POA: Diagnosis not present

## 2019-01-30 DIAGNOSIS — E1142 Type 2 diabetes mellitus with diabetic polyneuropathy: Secondary | ICD-10-CM | POA: Diagnosis not present

## 2019-03-01 DIAGNOSIS — Z01118 Encounter for examination of ears and hearing with other abnormal findings: Secondary | ICD-10-CM | POA: Diagnosis not present

## 2019-03-01 DIAGNOSIS — Z01021 Encounter for examination of eyes and vision following failed vision screening with abnormal findings: Secondary | ICD-10-CM | POA: Diagnosis not present

## 2019-03-01 DIAGNOSIS — Z1329 Encounter for screening for other suspected endocrine disorder: Secondary | ICD-10-CM | POA: Diagnosis not present

## 2019-03-01 DIAGNOSIS — Z5181 Encounter for therapeutic drug level monitoring: Secondary | ICD-10-CM | POA: Diagnosis not present

## 2019-03-01 DIAGNOSIS — Z1389 Encounter for screening for other disorder: Secondary | ICD-10-CM | POA: Diagnosis not present

## 2019-03-01 DIAGNOSIS — I10 Essential (primary) hypertension: Secondary | ICD-10-CM | POA: Diagnosis not present

## 2019-03-01 DIAGNOSIS — Z136 Encounter for screening for cardiovascular disorders: Secondary | ICD-10-CM | POA: Diagnosis not present

## 2019-03-01 DIAGNOSIS — Z131 Encounter for screening for diabetes mellitus: Secondary | ICD-10-CM | POA: Diagnosis not present

## 2019-03-01 DIAGNOSIS — Z0001 Encounter for general adult medical examination with abnormal findings: Secondary | ICD-10-CM | POA: Diagnosis not present

## 2019-03-01 DIAGNOSIS — E1165 Type 2 diabetes mellitus with hyperglycemia: Secondary | ICD-10-CM | POA: Diagnosis not present

## 2019-04-09 DIAGNOSIS — H401133 Primary open-angle glaucoma, bilateral, severe stage: Secondary | ICD-10-CM | POA: Diagnosis not present

## 2019-05-01 DIAGNOSIS — Z794 Long term (current) use of insulin: Secondary | ICD-10-CM | POA: Diagnosis not present

## 2019-05-01 DIAGNOSIS — I10 Essential (primary) hypertension: Secondary | ICD-10-CM | POA: Diagnosis not present

## 2019-05-01 DIAGNOSIS — E1165 Type 2 diabetes mellitus with hyperglycemia: Secondary | ICD-10-CM | POA: Diagnosis not present

## 2019-05-01 DIAGNOSIS — E7800 Pure hypercholesterolemia, unspecified: Secondary | ICD-10-CM | POA: Diagnosis not present

## 2019-05-01 DIAGNOSIS — E1142 Type 2 diabetes mellitus with diabetic polyneuropathy: Secondary | ICD-10-CM | POA: Diagnosis not present

## 2019-05-16 ENCOUNTER — Other Ambulatory Visit: Payer: Self-pay

## 2019-05-16 ENCOUNTER — Encounter: Payer: Self-pay | Admitting: Podiatry

## 2019-05-16 ENCOUNTER — Ambulatory Visit: Payer: Medicare Other | Admitting: Podiatry

## 2019-05-16 VITALS — BP 161/77 | HR 80

## 2019-05-16 DIAGNOSIS — L84 Corns and callosities: Secondary | ICD-10-CM

## 2019-05-16 DIAGNOSIS — M79675 Pain in left toe(s): Secondary | ICD-10-CM

## 2019-05-16 DIAGNOSIS — Z794 Long term (current) use of insulin: Secondary | ICD-10-CM | POA: Diagnosis not present

## 2019-05-16 DIAGNOSIS — B351 Tinea unguium: Secondary | ICD-10-CM | POA: Diagnosis not present

## 2019-05-16 DIAGNOSIS — I872 Venous insufficiency (chronic) (peripheral): Secondary | ICD-10-CM

## 2019-05-16 DIAGNOSIS — M79674 Pain in right toe(s): Secondary | ICD-10-CM

## 2019-05-16 DIAGNOSIS — E119 Type 2 diabetes mellitus without complications: Secondary | ICD-10-CM | POA: Diagnosis not present

## 2019-05-16 DIAGNOSIS — Z0189 Encounter for other specified special examinations: Secondary | ICD-10-CM

## 2019-05-16 NOTE — Patient Instructions (Addendum)
Compression Stockings/Socks  [facility] [address] [place], [place]  [address]  [phone] Call for an appointment  Diabetes Mellitus and Foot Care Foot care is an important part of your health, especially when you have diabetes. Diabetes may cause you to have problems because of poor blood flow (circulation) to your feet and legs, which can cause your skin to:  Become thinner and drier.  Break more easily.  Heal more slowly.  Peel and crack. You may also have nerve damage (neuropathy) in your legs and feet, causing decreased feeling in them. This means that you may not notice minor injuries to your feet that could lead to more serious problems. Noticing and addressing any potential problems early is the best way to prevent future foot problems. How to care for your feet Foot hygiene  Wash your feet daily with warm water and mild soap. Do not use hot water. Then, pat your feet and the areas between your toes until they are completely dry. Do not soak your feet as this can dry your skin.  Trim your toenails straight across. Do not dig under them or around the cuticle. File the edges of your nails with an emery board or nail file.  Apply a moisturizing lotion or petroleum jelly to the skin on your feet and to dry, brittle toenails. Use lotion that does not contain alcohol and is unscented. Do not apply lotion between your toes. Shoes and socks  Wear clean socks or stockings every day. Make sure they are not too tight. Do not wear knee-high stockings since they may decrease blood flow to your legs.  Wear shoes that fit properly and have enough cushioning. Always look in your shoes before you put them on to be sure there are no objects inside.  To break in new shoes, wear them for just a few hours a day. This prevents injuries on your feet. Wounds, scrapes, corns, and calluses  Check your feet daily for blisters, cuts, bruises, sores, and redness. If you cannot see  the bottom of your feet, use a mirror or ask someone for help.  Do not cut corns or calluses or try to remove them with medicine.  If you find a minor scrape, cut, or break in the skin on your feet, keep it and the skin around it clean and dry. You may clean these areas with mild soap and water. Do not clean the area with peroxide, alcohol, or iodine.  If you have a wound, scrape, corn, or callus on your foot, look at it several times a day to make sure it is healing and not infected. Check for: ? Redness, swelling, or pain. ? Fluid or blood. ? Warmth. ? Pus or a bad smell. General instructions  Do not cross your legs. This may decrease blood flow to your feet.  Do not use heating pads or hot water bottles on your feet. They may burn your skin. If you have lost feeling in your feet or legs, you may not know this is happening until it is too late.  Protect your feet from hot and cold by wearing shoes, such as at the beach or on hot pavement.  Schedule a complete foot exam at least once a year (annually) or more often if you have foot problems. If you have foot problems, report any cuts, sores, or bruises to your health care provider immediately. Contact a health care provider if:  You have a medical condition that increases your risk   of infection and you have any cuts, sores, or bruises on your feet.  You have an injury that is not healing.  You have redness on your legs or feet.  You feel burning or tingling in your legs or feet.  You have pain or cramps in your legs and feet.  Your legs or feet are numb.  Your feet always feel cold.  You have pain around a toenail. Get help right away if:  You have a wound, scrape, corn, or callus on your foot and: ? You have pain, swelling, or redness that gets worse. ? You have fluid or blood coming from the wound, scrape, corn, or callus. ? Your wound, scrape, corn, or callus feels warm to the touch. ? You have pus or a bad smell coming  from the wound, scrape, corn, or callus. ? You have a fever. ? You have a red line going up your leg. Summary  Check your feet every day for cuts, sores, red spots, swelling, and blisters.  Moisturize feet and legs daily.  Wear shoes that fit properly and have enough cushioning.  If you have foot problems, report any cuts, sores, or bruises to your health care provider immediately.  Schedule a complete foot exam at least once a year (annually) or more often if you have foot problems. This information is not intended to replace advice given to you by your health care provider. Make sure you discuss any questions you have with your health care provider. Document Released: 06/18/2000 Document Revised: 08/03/2017 Document Reviewed: 07/23/2016 Elsevier Patient Education  2020 [employer]  

## 2019-05-19 IMAGING — CR DG KNEE COMPLETE 4+V*R*
4 series · 4 of 4 positions shown · non-contrast
Comparison: None.

CLINICAL DATA: Fall, right knee pain

EXAM:
RIGHT KNEE - COMPLETE 4+ VIEW

[t knee ap right]
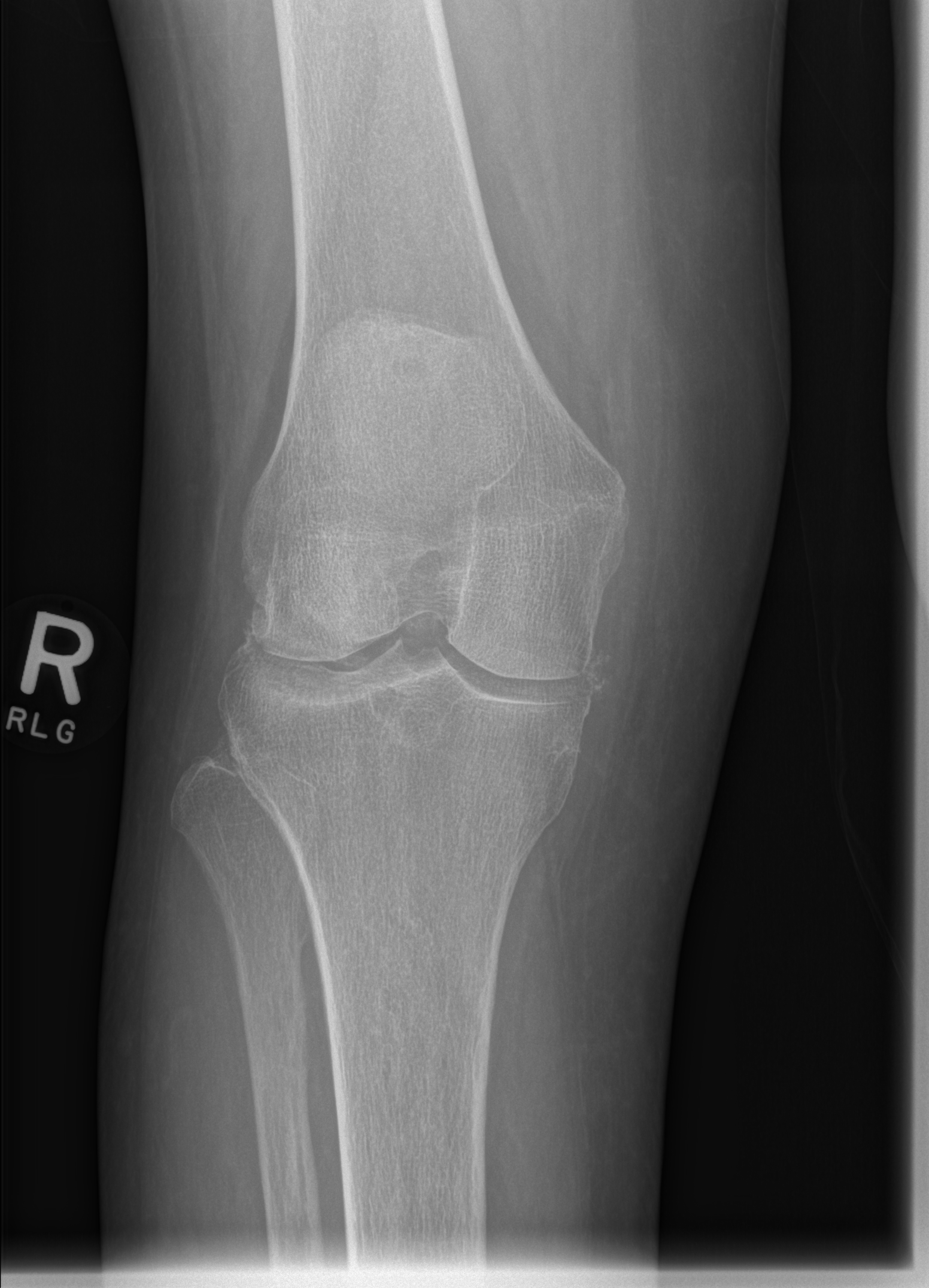

[t knee obl right (1 of 2)]
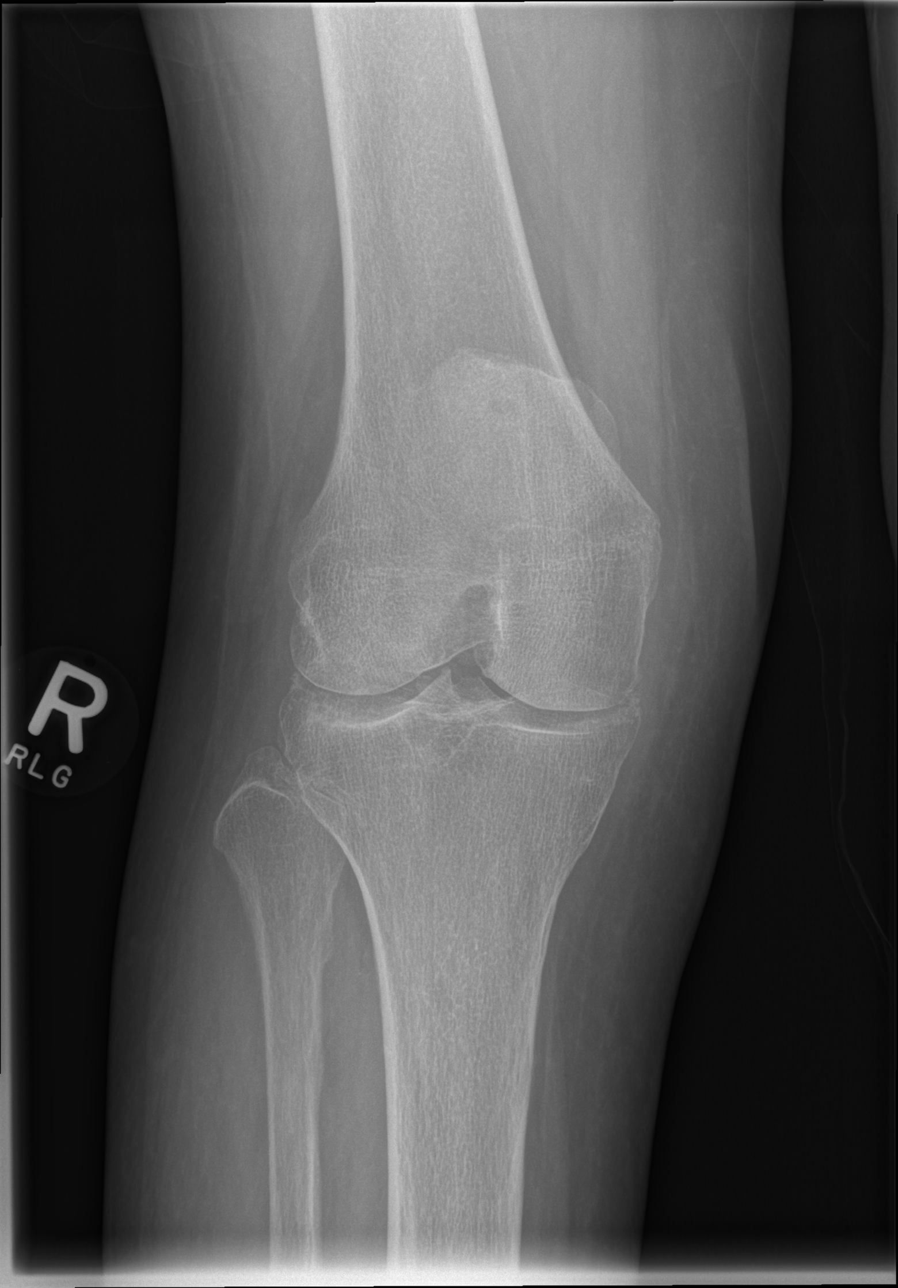

[t knee obl right (2 of 2)]
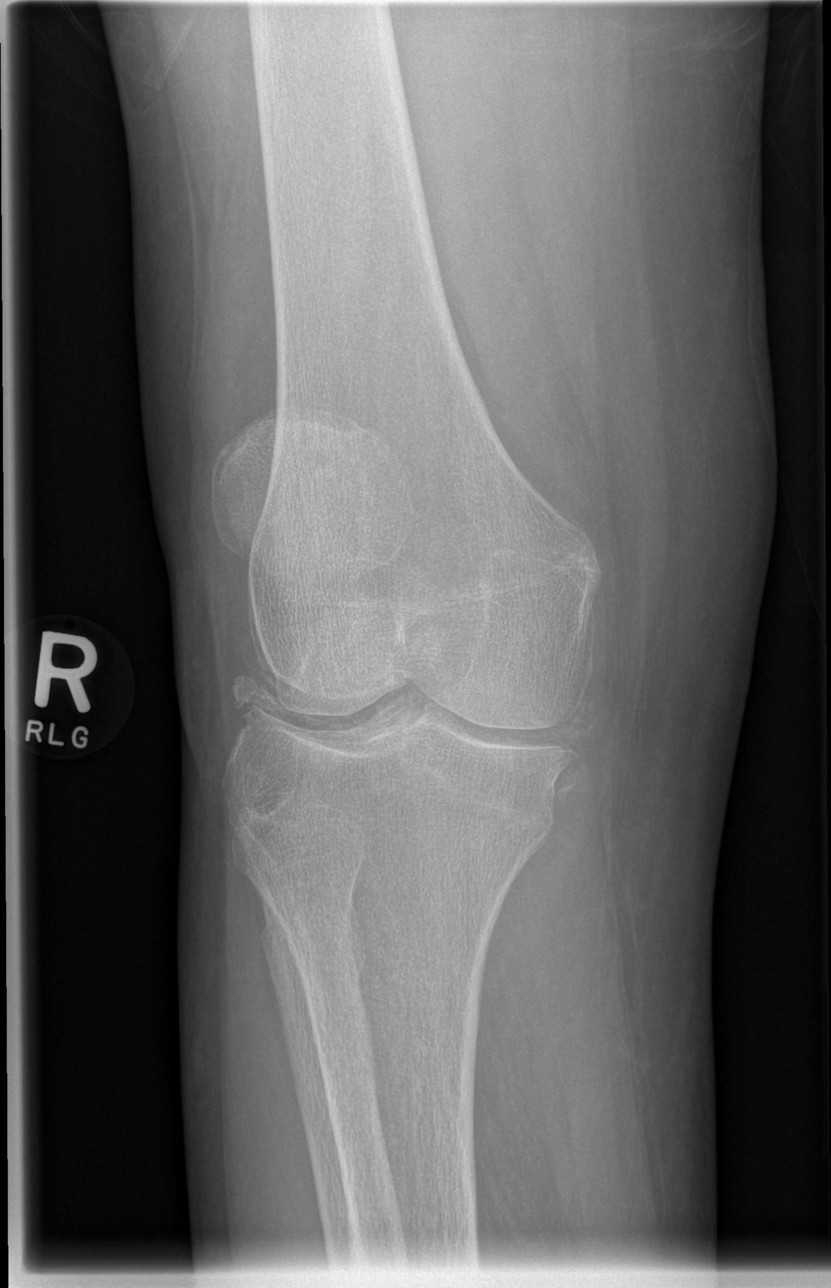

[t knee lat right]
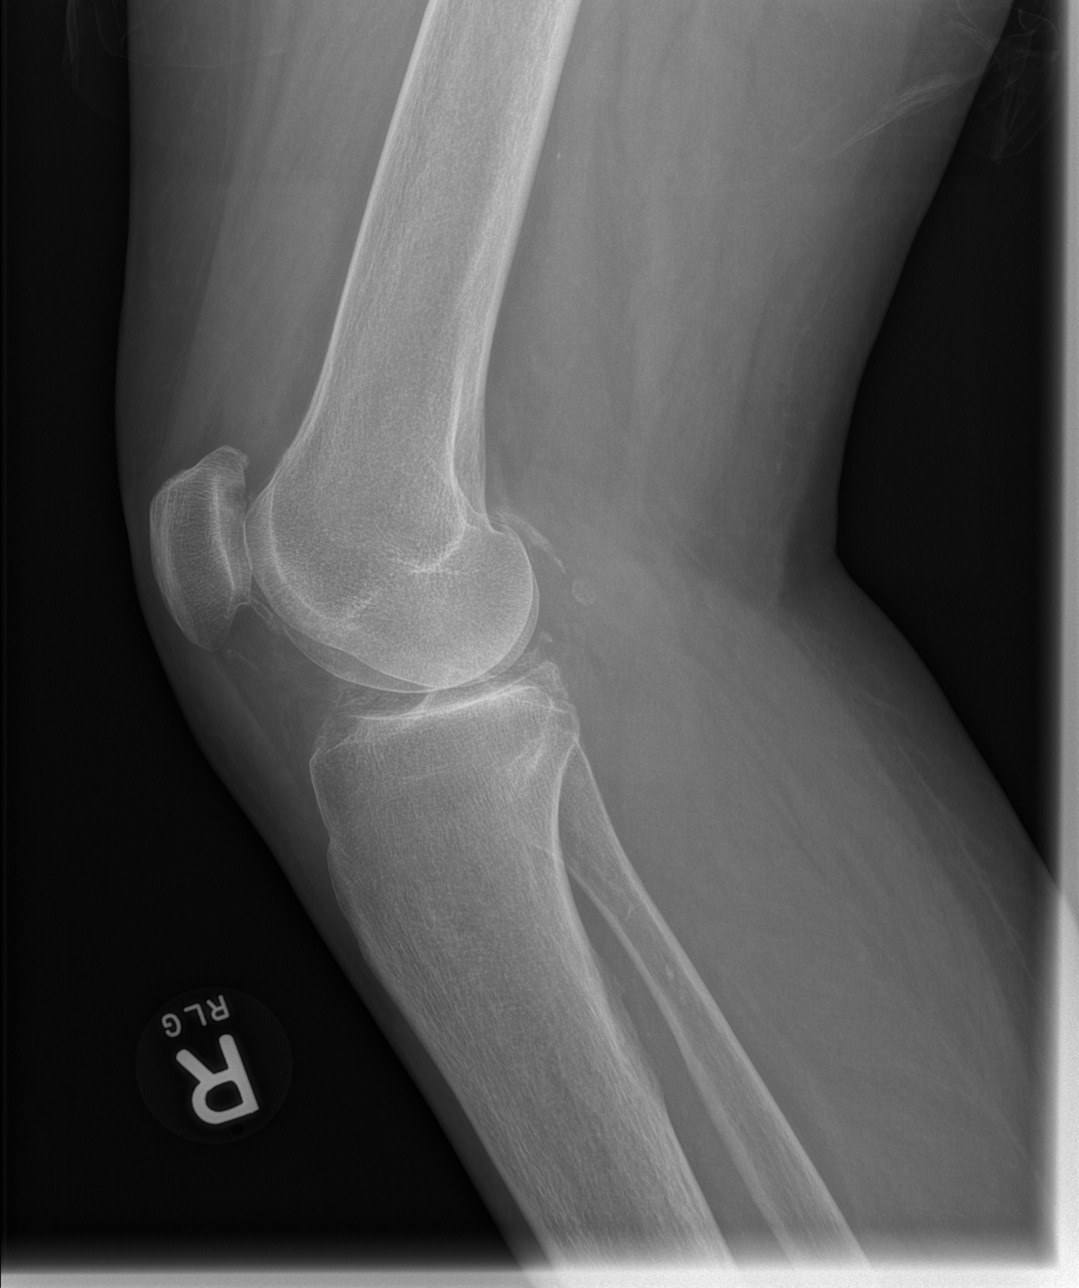

[4 of 4 positions shown; findings below may reference images not displayed]

FINDINGS: Moderate to advanced tricompartment degenerative changes with joint
space narrowing and spurring. Chondrocalcinosis. Moderate joint
effusion. No acute bony abnormality. Specifically, no fracture,
subluxation, or dislocation. Soft tissues are intact.
IMPRESSION: Chondrocalcinosis with moderate advanced osteoarthritis. Moderate
joint effusion.

## 2019-05-23 NOTE — Progress Notes (Signed)
Subjective: Joann Anderson presents today referred by Benito Mccreedy, MD for diabetic foot evaluation.  Patient relates 51 year history of diabetes.  Patient denies any history of foot wounds.  Patient denies any history of numbness, tingling, burning, pins/needles sensations.  Today, patient c/o of painful, discolored, thick toenails which interfere with daily activities.  Pain is aggravated when wearing enclosed shoe gear.   Past Medical History:  Diagnosis Date  . Cancer ([place])   . Diabetes mellitus without complication ([place])   . Hyperlipemia     Patient Active Problem List   Diagnosis Date Noted  . Ptosis of eyelid 07/02/2013  . Cataract, nuclear 05/15/2012  . Primary open angle glaucoma of both eyes, severe stage 05/15/2012    Past Surgical History:  Procedure Laterality Date  . ABDOMINAL HYSTERECTOMY    . BREAST SURGERY      Current Outpatient Medications on File Prior to Visit  Medication Sig Dispense Refill  . amLODipine (NORVASC) 10 MG tablet Take 10 mg by mouth daily.    Marland Kitchen aspirin 81 MG tablet Take 81 mg by mouth daily.    Marland Kitchen atorvastatin (LIPITOR) 20 MG tablet Take 20 mg by mouth daily.    . bimatoprost (LUMIGAN) 0.01 % SOLN Place 1 drop into both eyes at bedtime.    . brimonidine (ALPHAGAN P) 0.1 % SOLN Place 1 drop into both eyes 2 (two) times daily.    . dorzolamide-timolol (COSOPT) 22.3-6.8 MG/ML ophthalmic solution Place 1 drop into both eyes 2 (two) times daily.    . furosemide (LASIX) 20 MG tablet Take 20 mg by mouth daily.    Marland Kitchen HUMULIN 70/30 (70-30) 100 UNIT/ML injection INJECT 30 UNITS SUBCUTANEOUSLY EVERY MORNING AND 5 UNITS IN THE EVENING    . HUMULIN N 100 UNIT/ML injection Inject 10-30 Units into the skin 2 (two) times daily. 30 units every morning and 10 units every evening  12  . mupirocin cream (BACTROBAN) 2 % Apply 1 application topically 2 (two) times daily. (Patient not taking: Reported on 02/21/2017) 15 g 0  . pilocarpine (PILOCAR) 4 %  ophthalmic solution Place 1 drop into the right eye 4 (four) times daily.    . pregabalin (LYRICA) 25 MG capsule Take 25 mg by mouth daily as needed (pain).     Marland Kitchen timolol (TIMOPTIC) 0.5 % ophthalmic solution Place 1 drop into both eyes at bedtime.     . traMADol (ULTRAM) 50 MG tablet Take 1 tablet (50 mg total) by mouth every 6 (six) hours as needed. (Patient not taking: Reported on 05/16/2019) 15 tablet 0  . valsartan-hydrochlorothiazide (DIOVAN-HCT) 160-12.5 MG tablet Take 1 tablet by mouth daily.  3   No current facility-administered medications on file prior to visit.      Allergies  Allergen Reactions  . Aspirin Other (See Comments)    Causes bleeding  . Quinine Rash  . Sulfamethoxazole Rash    Social History   Occupational History  . Not on file  Tobacco Use  . Smoking status: Never Smoker  . Smokeless tobacco: Never Used  Substance and Sexual Activity  . Alcohol use: No  . Drug use: No  . Sexual activity: Yes    Birth control/protection: Surgical    History reviewed. No pertinent family history.   There is no immunization history on file for this patient.  Review of systems: Positive Findings in bold print.  Constitutional:  chills, fatigue, fever, sweats, weight change Communication: Optometrist, sign Ecologist, hand writing, iPad/Android device Head:  headaches, head injury Eyes: changes in vision, eye pain, glaucoma, cataracts, macular degeneration, diplopia, glare,  light sensitivity, eyeglasses or contacts, blindness Ears nose mouth throat: hearing impaired, hearing aids,  ringing in ears, deaf, sign language,  vertigo, nosebleeds,  rhinitis,  cold sores, snoring, swollen glands Cardiovascular: HTN, edema, arrhythmia, pacemaker in place, defibrillator in place, chest pain/tightness, chronic anticoagulation, blood clot, heart failure, MI Peripheral Vascular: leg cramps, varicose veins, blood clots, lymphedema, varicosities Respiratory:  difficulty  breathing, denies congestion, SOB, wheezing, cough, emphysema Gastrointestinal: change in appetite or weight, abdominal pain, constipation, diarrhea, nausea, vomiting, vomiting blood, change in bowel habits, abdominal pain, jaundice, rectal bleeding, hemorrhoids, GERD Genitourinary:  nocturia,  pain on urination, polyuria,  blood in urine, Foley catheter, urinary urgency, ESRD on hemodialysis Musculoskeletal: amputation, cramping, stiff joints, painful joints, decreased joint motion, fractures, OA, gout, hemiplegia, paraplegia, uses cane, wheelchair bound, uses walker, uses rollator Skin: +changes in toenails, color change, dryness, itching, mole changes,  rash, wound(s) Neurological: headaches, numbness in feet, paresthesias in feet, burning in feet, fainting,  seizures, change in speech,  headaches, memory problems/poor historian, cerebral palsy, weakness, paralysis, CVA, TIA Endocrine: diabetes, hypothyroidism, hyperthyroidism,  goiter, dry mouth, flushing, heat intolerance,  cold intolerance,  excessive thirst, denies polyuria,  nocturia Hematological:  easy bleeding, excessive bleeding, easy bruising, enlarged lymph nodes, on long term blood thinner, history of past transusions Allergy/immunological:  hives, eczema, frequent infections, multiple drug allergies, seasonal allergies, transplant recipient, multiple food allergies Psychiatric:  anxiety, depression, mood disorder, suicidal ideations, hallucinations, insomnia  Objective: Vitals:   05/16/19 1450  BP: (!) 161/77  Pulse: 80   Vascular Examination: Capillary refill time immediate to all 10 digits.  Dorsalis pedis and posterior tibial pulses are palpable bilaterally.  Trace edema noted bilateral lower extremities.  There is no pain with calf compression noted bilaterally.  Digital hair is sparse bilaterally.  Skin temperature gradient WNL b/l  Dermatological Examination: Skin with normal turgor, texture and tone b/l.  Skin  changes consistent with venous stasis noted bilateral lower extremities.  Toenails 1-5 b/l discolored, thick, dystrophic with subungual debris and pain with palpation to nailbeds due to thickness of nails.  Hyperkeratotic lesion noted dorsal fifth digit PIPJ bilaterally.  There is no erythema, no edema, no drainage, no flocculence noted.  Musculoskeletal: Muscle strength 5/5 to all LE muscle groups bilaterally.  Hallux abductovalgus with bunion deformity noted bilaterally.  Hammertoes 2 through 5 bilaterally.  No pain, crepitus or joint discomfort with active or passive range of motion bilaterally.  Neurological: Sensation intact 5/5 bilaterally with 10 gram monofilament  Vibratory sensation intact bilaterally. Assessment: 1. Painful onychomycosis toenails 1-5 b/l  2. Corn fifth digit bilaterally 3. Bilateral lower extremity secondary to venous insufficiency bilaterally 4. NIDDM  Plan: 1. Discussed diabetic foot care principles. Literature dispensed on today. Corn(s) pared bilateral fifth digits utilizing sterile scalpel blade without incident. Toenails 1-5 b/l were debrided in length and girth without iatrogenic bleeding. Discussed chronic lower extremity edema and recommended compression hose therapy.  Prescription written for 2 pair of knee-high compression hose, 20 to 30 mmHg.  She is to apply every morning and remove every evening. Patient to continue soft, supportive shoe gear. Patient to report any pedal injuries to medical professional immediately. Follow up 3 months.  Patient/POA to call should there be a concern in the interim.

## 2019-08-02 DIAGNOSIS — H401133 Primary open-angle glaucoma, bilateral, severe stage: Secondary | ICD-10-CM | POA: Diagnosis not present

## 2019-08-15 ENCOUNTER — Encounter: Payer: Self-pay | Admitting: Podiatry

## 2019-08-15 ENCOUNTER — Ambulatory Visit: Payer: Medicare Other | Admitting: Podiatry

## 2019-08-15 ENCOUNTER — Other Ambulatory Visit: Payer: Self-pay

## 2019-08-15 DIAGNOSIS — B351 Tinea unguium: Secondary | ICD-10-CM | POA: Diagnosis not present

## 2019-08-15 DIAGNOSIS — L84 Corns and callosities: Secondary | ICD-10-CM | POA: Diagnosis not present

## 2019-08-15 DIAGNOSIS — I872 Venous insufficiency (chronic) (peripheral): Secondary | ICD-10-CM

## 2019-08-15 DIAGNOSIS — E119 Type 2 diabetes mellitus without complications: Secondary | ICD-10-CM | POA: Diagnosis not present

## 2019-08-15 DIAGNOSIS — Z794 Long term (current) use of insulin: Secondary | ICD-10-CM | POA: Diagnosis not present

## 2019-08-15 DIAGNOSIS — M79675 Pain in left toe(s): Secondary | ICD-10-CM

## 2019-08-15 DIAGNOSIS — M79674 Pain in right toe(s): Secondary | ICD-10-CM | POA: Diagnosis not present

## 2019-08-15 NOTE — Patient Instructions (Addendum)
Diabetes Mellitus and Foot Care Foot care is an important part of your health, especially when you have diabetes. Diabetes may cause you to have problems because of poor blood flow (circulation) to your feet and legs, which can cause your skin to:  Become thinner and drier.  Break more easily.  Heal more slowly.  Peel and crack. You may also have nerve damage (neuropathy) in your legs and feet, causing decreased feeling in them. This means that you may not notice minor injuries to your feet that could lead to more serious problems. Noticing and addressing any potential problems early is the best way to prevent future foot problems. How to care for your feet Foot hygiene  Wash your feet daily with warm water and mild soap. Do not use hot water. Then, pat your feet and the areas between your toes until they are completely dry. Do not soak your feet as this can dry your skin.  Trim your toenails straight across. Do not dig under them or around the cuticle. File the edges of your nails with an emery board or nail file.  Apply a moisturizing lotion or petroleum jelly to the skin on your feet and to dry, brittle toenails. Use lotion that does not contain alcohol and is unscented. Do not apply lotion between your toes. Shoes and socks  Wear clean socks or stockings every day. Make sure they are not too tight. Do not wear knee-high stockings since they may decrease blood flow to your legs.  Wear shoes that fit properly and have enough cushioning. Always look in your shoes before you put them on to be sure there are no objects inside.  To break in new shoes, wear them for just a few hours a day. This prevents injuries on your feet. Wounds, scrapes, corns, and calluses  Check your feet daily for blisters, cuts, bruises, sores, and redness. If you cannot see the bottom of your feet, use a mirror or ask someone for help.  Do not cut corns or calluses or try to remove them with medicine.  If  you find a minor scrape, cut, or break in the skin on your feet, keep it and the skin around it clean and dry. You may clean these areas with mild soap and water. Do not clean the area with peroxide, alcohol, or iodine.  If you have a wound, scrape, corn, or callus on your foot, look at it several times a day to make sure it is healing and not infected. Check for: ? Redness, swelling, or pain. ? Fluid or blood. ? Warmth. ? Pus or a bad smell. General instructions  Do not cross your legs. This may decrease blood flow to your feet.  Do not use heating pads or hot water bottles on your feet. They may burn your skin. If you have lost feeling in your feet or legs, you may not know this is happening until it is too late.  Protect your feet from hot and cold by wearing shoes, such as at the beach or on hot pavement.  Schedule a complete foot exam at least once a year (annually) or more often if you have foot problems. If you have foot problems, report any cuts, sores, or bruises to your health care provider immediately. Contact a health care provider if:  You have a medical condition that increases your risk of infection and you have any cuts, sores, or bruises on your feet.  You have an injury that is  not healing.  You have redness on your legs or feet.  You feel burning or tingling in your legs or feet.  You have pain or cramps in your legs and feet.  Your legs or feet are numb.  Your feet always feel cold.  You have pain around a toenail. Get help right away if:  You have a wound, scrape, corn, or callus on your foot and: ? You have pain, swelling, or redness that gets worse. ? You have fluid or blood coming from the wound, scrape, corn, or callus. ? Your wound, scrape, corn, or callus feels warm to the touch. ? You have pus or a bad smell coming from the wound, scrape, corn, or callus. ? You have a fever. ? You have a red line going up your leg. Summary  Check your feet every  day for cuts, sores, red spots, swelling, and blisters.  Moisturize feet and legs daily.  Wear shoes that fit properly and have enough cushioning.  If you have foot problems, report any cuts, sores, or bruises to your health care provider immediately.  Schedule a complete foot exam at least once a year (annually) or more often if you have foot problems. This information is not intended to replace advice given to you by your health care provider. Make sure you discuss any questions you have with your health care provider. Document Revised: 03/14/2019 Document Reviewed: 07/23/2016 Elsevier Patient Education  [place].     Edema  Edema is an abnormal buildup of fluids in the body tissues and under the skin. Swelling of the legs, feet, and ankles is a common symptom that becomes more likely as you get older. Swelling is also common in looser tissues, like around the eyes. When the affected area is squeezed, the fluid may move out of that spot and leave a dent for a few moments. This dent is called pitting edema. There are many possible causes of edema. Eating too much salt (sodium) and being on your feet or sitting for a long time can cause edema in your legs, feet, and ankles. Hot weather may make edema worse. Common causes of edema include:  Heart failure.  Liver or kidney disease.  Weak leg blood vessels.  Cancer.  An injury.  Pregnancy.  Medicines.  Being obese.  Low protein levels in the blood. Edema is usually painless. Your skin may look swollen or shiny. Follow these instructions at home:  Keep the affected body part raised (elevated) above the level of your heart when you are sitting or lying down.  Do not sit still or stand for long periods of time.  Do not wear tight clothing. Do not wear garters on your upper legs.  Exercise your legs to get your circulation going. This helps to move the fluid back into your blood vessels, and it may help the swelling  go down.  Wear elastic bandages or support stockings to reduce swelling as told by your health care provider.  Eat a low-salt (low-sodium) diet to reduce fluid as told by your health care provider.  Depending on the cause of your swelling, you may need to limit how much fluid you drink (fluid restriction).  Take over-the-counter and prescription medicines only as told by your health care provider. Contact a health care provider if:  Your edema does not get better with treatment.  You have heart, liver, or kidney disease and have symptoms of edema.  You have sudden and unexplained weight gain. Get  help right away if:  You develop shortness of breath or chest pain.  You cannot breathe when you lie down.  You develop pain, redness, or warmth in the swollen areas.  You have heart, liver, or kidney disease and suddenly get edema.  You have a fever and your symptoms suddenly get worse. Summary  Edema is an abnormal buildup of fluids in the body tissues and under the skin.  Eating too much salt (sodium) and being on your feet or sitting for a long time can cause edema in your legs, feet, and ankles.  Keep the affected body part raised (elevated) above the level of your heart when you are sitting or lying down. This information is not intended to replace advice given to you by your health care provider. Make sure you discuss any questions you have with your health care provider. Document Revised: 11/08/2018 Document Reviewed: 07/24/2016 Elsevier Patient Education  [place].  Onychomycosis/Fungal Toenails  WHAT IS IT? An infection that lies within the keratin of your nail plate that is caused by a fungus.  WHY ME? Fungal infections affect all ages, sexes, races, and creeds.  There may be many factors that predispose you to a fungal infection such as age, coexisting medical conditions such as diabetes, or an autoimmune disease; stress, medications, fatigue, genetics, etc.   Bottom line: fungus thrives in a warm, moist environment and your shoes offer such a location.  IS IT CONTAGIOUS? Theoretically, yes.  You do not want to share shoes, nail clippers or files with someone who has fungal toenails.  Walking around barefoot in the same room or sleeping in the same bed is unlikely to transfer the organism.  It is important to realize, however, that fungus can spread easily from one nail to the next on the same foot.  HOW DO WE TREAT THIS?  There are several ways to treat this condition.  Treatment may depend on many factors such as age, medications, pregnancy, liver and kidney conditions, etc.  It is best to ask your doctor which options are available to you.  14. No treatment.   Unlike many other medical concerns, you can live with this condition.  However for many people this can be a painful condition and may lead to ingrown toenails or a bacterial infection.  It is recommended that you keep the nails cut short to help reduce the amount of fungal nail. 15. Topical treatment.  These range from herbal remedies to prescription strength nail lacquers.  About 40-50% effective, topicals require twice daily application for approximately 9 to 12 months or until an entirely new nail has grown out.  The most effective topicals are medical grade medications available through physicians offices. 16. Oral antifungal medications.  With an 80-90% cure rate, the most common oral medication requires 3 to 4 months of therapy and stays in your system for a year as the new nail grows out.  Oral antifungal medications do require blood work to make sure it is a safe drug for you.  A liver function panel will be performed prior to starting the medication and after the first month of treatment.  It is important to have the blood work performed to avoid any harmful side effects.  In general, this medication safe but blood work is required. 17. Laser Therapy.  This treatment is performed by applying a  specialized laser to the affected nail plate.  This therapy is noninvasive, fast, and non-painful.  It is not covered by  insurance and is therefore, out of pocket.  The results have been very good with a 80-95% cure rate.  [facility] is the only practice in the area to offer this therapy. 18. Permanent Nail Avulsion.  Removing the entire nail so that a new nail will not grow back.

## 2019-08-15 NOTE — Progress Notes (Signed)
Subjective: Joann Anderson presents today for follow up of preventative diabetic foot care and corn(s) b/l 5th digits and painful mycotic toenails b/l that are difficult to trim. Pain interferes with ambulation. Aggravating factors include wearing enclosed shoe gear. Pain is relieved with periodic professional debridement.   She states she purchased the below knee compression stockings for her lower extremity edema, but they caused her right knee to swell, so she stopped wearing them.  Allergies  Allergen Reactions  . Aspirin Other (See Comments)    Causes bleeding  . Quinine Rash  . Sulfamethoxazole Rash    Objective: There were no vitals filed for this visit.  Vascular Examination:  Capillary refill time to digits immediate b/l, palpable DP pulses b/l, palpable PT pulses b/l, pedal hair sparse b/l, skin temperature gradient within normal limits b/l and nonpitting edema noted b/l LE  Dermatological Examination: Pedal skin with normal turgor, texture and tone bilaterally, no open wounds bilaterally, no interdigital macerations bilaterally, toenails 1-5 b/l elongated, dystrophic, thickened, crumbly with subungual debris and hyperkeratotic lesion(s) 5th digits b/l.  No erythema, no edema, no drainage, no flocculence  Musculoskeletal: Normal muscle strength 5/5 to all lower extremity muscle groups bilaterally, no pain crepitus or joint limitation noted with ROM b/l, bunion deformity noted b/l and hammertoes noted to the  b/l 5th digits  Neurological: Protective sensation intact 5/5 intact bilaterally with 10g monofilament b/l and vibratory sensation intact b/l  Assessment: Pain due to onychomycosis of toenails of both feet  Corns b/l 5th digits  Type 2 diabetes mellitus without complication, with long-term current use of insulin (HCC)  Venous insufficiency with LE edema b/l  Plan: -Continue diabetic foot care principles. Literature dispensed on today.  -Discussed compression  stockings. Discussed switching to thigh high compression hose, but she declines on today. Educated her on long term uncontrolled lower extremity edema. Patient related understanding. -Toenails 1-5 b/l were debrided in length and girth without iatrogenic bleeding. -corn(s) debrided b/l 5th digits without complication or incident. Total number debrided=2 -Patient to continue soft, supportive shoe gear daily. -Patient to report any pedal injuries to medical professional immediately. -Patient/POA to call should there be question/concern in the interim.  Return in about 3 months (around 11/12/2019) for nail trim.

## 2019-09-04 DIAGNOSIS — E7800 Pure hypercholesterolemia, unspecified: Secondary | ICD-10-CM | POA: Diagnosis not present

## 2019-09-04 DIAGNOSIS — I10 Essential (primary) hypertension: Secondary | ICD-10-CM | POA: Diagnosis not present

## 2019-09-04 DIAGNOSIS — E1142 Type 2 diabetes mellitus with diabetic polyneuropathy: Secondary | ICD-10-CM | POA: Diagnosis not present

## 2019-09-04 DIAGNOSIS — E1165 Type 2 diabetes mellitus with hyperglycemia: Secondary | ICD-10-CM | POA: Diagnosis not present

## 2019-09-04 DIAGNOSIS — Z794 Long term (current) use of insulin: Secondary | ICD-10-CM | POA: Diagnosis not present

## 2019-11-05 DIAGNOSIS — H401133 Primary open-angle glaucoma, bilateral, severe stage: Secondary | ICD-10-CM | POA: Diagnosis not present

## 2019-11-14 ENCOUNTER — Ambulatory Visit: Payer: Medicare Other | Admitting: Podiatry

## 2019-11-29 DIAGNOSIS — E1165 Type 2 diabetes mellitus with hyperglycemia: Secondary | ICD-10-CM | POA: Diagnosis not present

## 2019-11-29 DIAGNOSIS — E1142 Type 2 diabetes mellitus with diabetic polyneuropathy: Secondary | ICD-10-CM | POA: Diagnosis not present

## 2019-11-29 DIAGNOSIS — Z0000 Encounter for general adult medical examination without abnormal findings: Secondary | ICD-10-CM | POA: Diagnosis not present

## 2019-11-29 DIAGNOSIS — I10 Essential (primary) hypertension: Secondary | ICD-10-CM | POA: Diagnosis not present

## 2019-11-29 DIAGNOSIS — E7800 Pure hypercholesterolemia, unspecified: Secondary | ICD-10-CM | POA: Diagnosis not present

## 2020-01-31 DIAGNOSIS — I10 Essential (primary) hypertension: Secondary | ICD-10-CM | POA: Diagnosis not present

## 2020-01-31 DIAGNOSIS — Z0001 Encounter for general adult medical examination with abnormal findings: Secondary | ICD-10-CM | POA: Diagnosis not present

## 2020-01-31 DIAGNOSIS — E1165 Type 2 diabetes mellitus with hyperglycemia: Secondary | ICD-10-CM | POA: Diagnosis not present

## 2020-01-31 DIAGNOSIS — E7800 Pure hypercholesterolemia, unspecified: Secondary | ICD-10-CM | POA: Diagnosis not present

## 2020-01-31 DIAGNOSIS — E1142 Type 2 diabetes mellitus with diabetic polyneuropathy: Secondary | ICD-10-CM | POA: Diagnosis not present

## 2020-02-27 DIAGNOSIS — H401133 Primary open-angle glaucoma, bilateral, severe stage: Secondary | ICD-10-CM | POA: Diagnosis not present

## 2020-05-01 DIAGNOSIS — E7800 Pure hypercholesterolemia, unspecified: Secondary | ICD-10-CM | POA: Diagnosis not present

## 2020-05-01 DIAGNOSIS — E1142 Type 2 diabetes mellitus with diabetic polyneuropathy: Secondary | ICD-10-CM | POA: Diagnosis not present

## 2020-05-01 DIAGNOSIS — E1165 Type 2 diabetes mellitus with hyperglycemia: Secondary | ICD-10-CM | POA: Diagnosis not present

## 2020-05-01 DIAGNOSIS — N1831 Chronic kidney disease, stage 3a: Secondary | ICD-10-CM | POA: Diagnosis not present

## 2020-05-01 DIAGNOSIS — Z23 Encounter for immunization: Secondary | ICD-10-CM | POA: Diagnosis not present

## 2020-05-01 DIAGNOSIS — I10 Essential (primary) hypertension: Secondary | ICD-10-CM | POA: Diagnosis not present

## 2020-06-02 DIAGNOSIS — E785 Hyperlipidemia, unspecified: Secondary | ICD-10-CM | POA: Diagnosis not present

## 2020-06-02 DIAGNOSIS — R011 Cardiac murmur, unspecified: Secondary | ICD-10-CM | POA: Diagnosis not present

## 2020-06-02 DIAGNOSIS — I129 Hypertensive chronic kidney disease with stage 1 through stage 4 chronic kidney disease, or unspecified chronic kidney disease: Secondary | ICD-10-CM | POA: Diagnosis not present

## 2020-06-02 DIAGNOSIS — E1122 Type 2 diabetes mellitus with diabetic chronic kidney disease: Secondary | ICD-10-CM | POA: Diagnosis not present

## 2020-06-02 DIAGNOSIS — N1832 Chronic kidney disease, stage 3b: Secondary | ICD-10-CM | POA: Diagnosis not present

## 2020-06-04 DIAGNOSIS — Z961 Presence of intraocular lens: Secondary | ICD-10-CM | POA: Diagnosis not present

## 2020-06-04 DIAGNOSIS — H401133 Primary open-angle glaucoma, bilateral, severe stage: Secondary | ICD-10-CM | POA: Diagnosis not present

## 2020-06-04 DIAGNOSIS — H2512 Age-related nuclear cataract, left eye: Secondary | ICD-10-CM | POA: Diagnosis not present

## 2020-06-19 DIAGNOSIS — N1831 Chronic kidney disease, stage 3a: Secondary | ICD-10-CM | POA: Diagnosis not present

## 2020-06-19 DIAGNOSIS — E7800 Pure hypercholesterolemia, unspecified: Secondary | ICD-10-CM | POA: Diagnosis not present

## 2020-06-19 DIAGNOSIS — E1165 Type 2 diabetes mellitus with hyperglycemia: Secondary | ICD-10-CM | POA: Diagnosis not present

## 2020-06-19 DIAGNOSIS — E1142 Type 2 diabetes mellitus with diabetic polyneuropathy: Secondary | ICD-10-CM | POA: Diagnosis not present

## 2020-06-19 DIAGNOSIS — I10 Essential (primary) hypertension: Secondary | ICD-10-CM | POA: Diagnosis not present

## 2020-07-01 DIAGNOSIS — E1142 Type 2 diabetes mellitus with diabetic polyneuropathy: Secondary | ICD-10-CM | POA: Diagnosis not present

## 2020-07-01 DIAGNOSIS — I10 Essential (primary) hypertension: Secondary | ICD-10-CM | POA: Diagnosis not present

## 2020-07-01 DIAGNOSIS — E1165 Type 2 diabetes mellitus with hyperglycemia: Secondary | ICD-10-CM | POA: Diagnosis not present

## 2020-07-01 DIAGNOSIS — E7800 Pure hypercholesterolemia, unspecified: Secondary | ICD-10-CM | POA: Diagnosis not present

## 2020-07-01 DIAGNOSIS — N1831 Chronic kidney disease, stage 3a: Secondary | ICD-10-CM | POA: Diagnosis not present

## 2020-07-30 DIAGNOSIS — H2512 Age-related nuclear cataract, left eye: Secondary | ICD-10-CM | POA: Diagnosis not present

## 2020-07-30 DIAGNOSIS — H401123 Primary open-angle glaucoma, left eye, severe stage: Secondary | ICD-10-CM | POA: Diagnosis not present

## 2020-07-30 NOTE — H&P (Signed)
 07/30/2020  Ophthalmology Surgical History & Physical  Primary Care Provider:  Zachary Joelyn Lares, MD  Attending:  Drs. BOND, BRENT J and [employer] complaint:   1. Primary open angle glaucoma (POAG) of left eye, severe stage   2. Nuclear sclerotic cataract, left     History of Present Illness:   Joann Anderson is a 87 y.o. female patient who recently discussed with Drs. BOND, BRENT J and [employer] risks, benefits, alternatives and indication for left eye trab & phaco PC IOL under GMO/RBB anesthesia.  For more detail concerning indication please, see attending's note.  Patient here today for H&P and wishes to proceed with the above.  Patient will go home with significant other after surgery.  Past Medical History:   Past Medical History:  Diagnosis Date  . Breast cancer, left breast (HCC) 1985   s/p left lump & chemo  . Cataract, nuclear sclerotic senile, left 06/04/2020  . Dementia (HCC)    I reviewed 06/19/2020 PCP notes that included Namenda & Aricept as active meds, however as of 07/30/2020 patient denies taking either med  . Essential hypertension 07/30/2020  . 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 Peripheral edema   . Polyneuropathy due to type 2 diabetes mellitus (HCC)   . Primary open angle glaucoma of both eyes, severe stage 05/15/2012   Dr. Thresa Bracket  . Pseudophakia of right eye 2007   Dr. Cleatus  . Ptosis of eyelid 07/02/2013  . Recurrent major depression in remission (HCC)   . Stage 3a chronic kidney disease (HCC)    GFR 57 Scr 1.01 K+ 4.5 on 07/01/2020  . Type 2 diabetes mellitus (HCC)    A1C 7.2 on 07/01/2020     Past Surgical History:   Past Surgical History:  Procedure Laterality Date  . ABDOMINAL HYSTERECTOMY    . BREAST LUMPECTOMY Left 1985  . CATARACT EXTRACTION W/  INTRAOCULAR LENS IMPLANT Right 2007   Dr. [facility]atus [facility]     Home medications:   Medication Sig  . ALPHAGAN P 0.1 % Drop  1 DROP INTO BOTH EYES  3 TIMES DAILY.  SABRA amLODIPine (NORVASC) 10 MG tablet 10 mg by mouth nightly.     SABRA ascorbic acid, vitamin C, (VITAMIN C) 1000 MG tablet  1,000 mg by mouth daily.  SABRA atorvastatin (LIPITOR) 20 MG tablet  20 mg by mouth daily.     . dorzolamide-timoloL (COSOPT) 22.3-6.8 mg/mL ophthalmic solution 1 DROP INTO BOTH EYES TWICE A DAY  . ergocalciferol, vitamin D2, (VITAMIN D ORAL) 1 capsule by mouth daily.  SABRA HUMULIN 70/30 U-100 INSULIN 100 unit/mL (70-30) injection  30 Units into the skin every morning. And 10 units at bedtime  . LUMIGAN 0.01 % ophthalmic drops  1 DROP INTO BOTH EYES EVERY DAY AT NIGHT  . LYRICA 25 mg capsule  25 mg by mouth as needed.     . multivit-min-iron-FA-lutein (CENTRUM SILVER WOMEN) 8 mg iron-400 mcg-300 mcg Tab  1 tablet by mouth daily.  SABRA omega-3 fatty acids-fish oil 300-1,000 mg capsule  1 g by mouth daily.  . valsartan-hydrochlorothiazide (DIOVAN-HCT) 160-12.5 mg per tablet  1 tablet by mouth every morning.  SABRA aspirin 81 MG EC tablet *ANTIPLATELET*  81 mg by mouth daily.   Last reviewed on 07/30/2020  1:08 PM by Glendale Bradley Sohmer, PA-C    Allergies:   Allergies  Allergen Reactions  . Aspirin Other (See Comments)    Causes bleeding  .  Quine Rash (ALLERGY/intolerance)  . Sulfamethoxazole Rash (ALLERGY/intolerance)     Family History:  family history includes Cataracts in her sister; Diabetes in her sister; Glaucoma in her sister; Hypertension in her sister; Stroke in her sister.   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 [place] [address]    Patient  reports that she has never smoked. She has never used smokeless tobacco. She reports that she does not drink alcohol.  Review of Systems:  See ophthalmologist notes, history present illness and past medical history otherwise all other systems reviewed and are negative.   Physical Exam:   Vitals:    07/30/20 1314  BP: 144/62  Pulse: 69  SpO2: 98% Comment: on RA   Body mass index is 25.68 kg/m.  General:  non-toxic, in no respiratory distress and acyanotic, cooperative  Eyes:  see attending's note  ENT: Hearing grossly intact  Neck: no bruit  Lungs:  clear to auscultation bilaterally  Heart:  regular rate and rhythm, S1, S2 normal, no murmur, click, rub or gallop  Abdomen:  bowel sounds present and normal in all 4 quadrants  Extremities:  extremities normal, atraumatic, no cyanosis or edema. Gait independent without assistive device  Neurologic:  alertness: alert, orientation: time, person, place    Assessment & Plan:   Joann Anderson is a 87 y.o. female patient who wishes to proceed with left eye trab w/phaco PC IOL, as recommended by Drs. BOND, BRENT J and Dr. Adina Sickles   1. Primary open angle glaucoma (POAG) of left eye, severe stage   2. Nuclear sclerotic cataract, left      There is no immunization history on file for this patient.  However, see COVID card in Media Tab.  Home with significant other after surgery Anesthesia consult DOS in the holding room Routine holding room drops Food/drink/medicine instructions for morning of surgery discussed at length and copy given to patient who voiced understanding (see in Notes tab Patient instructions).  Patient/significant other not sure if she has directions from [facility] telephone call that occurred on Jan 21st.   Calculations were obtained today and will be reviewed by Dr. Sickles who will also determine how to bill the patient. Aware to continue to avoid aspirin until after surgery and to stop fish oil today, per Dr. Geneva  Orders Placed This Encounter  Procedures  . IOL Master - OU - Both Eyes    Orders Placed This Encounter  Medications  . moxifloxacin (VIGAMOX) 0.5 % ophthalmic solution    Sig: STARTING 2 days before surgery instill 1 drop left eye four times daily, excluding morning of surgery     Dispense:  3 mL    Refill:  2     [id number] [address], [place] - [address] [place] [address] Phone: [phone] Fax: [phone]    Patient aware to continue routine follow up with primary medical doctor as usual  Electronically signed by:   Glendale Slater Fragmin, PA-C  Ophthalmology  07/30/2020     Electronically signed by: Glendale Slater Sohmer, PA-C 08/03/20 1521

## 2020-08-05 DIAGNOSIS — H401123 Primary open-angle glaucoma, left eye, severe stage: Secondary | ICD-10-CM | POA: Diagnosis not present

## 2020-08-05 DIAGNOSIS — I129 Hypertensive chronic kidney disease with stage 1 through stage 4 chronic kidney disease, or unspecified chronic kidney disease: Secondary | ICD-10-CM | POA: Diagnosis not present

## 2020-08-05 DIAGNOSIS — E1122 Type 2 diabetes mellitus with diabetic chronic kidney disease: Secondary | ICD-10-CM | POA: Diagnosis not present

## 2020-08-05 DIAGNOSIS — H25812 Combined forms of age-related cataract, left eye: Secondary | ICD-10-CM | POA: Diagnosis not present

## 2020-08-05 DIAGNOSIS — N1831 Chronic kidney disease, stage 3a: Secondary | ICD-10-CM | POA: Diagnosis not present

## 2020-08-06 DIAGNOSIS — Z7952 Long term (current) use of systemic steroids: Secondary | ICD-10-CM | POA: Diagnosis not present

## 2020-08-06 DIAGNOSIS — H401133 Primary open-angle glaucoma, bilateral, severe stage: Secondary | ICD-10-CM | POA: Diagnosis not present

## 2020-08-06 DIAGNOSIS — Z79899 Other long term (current) drug therapy: Secondary | ICD-10-CM | POA: Diagnosis not present

## 2020-08-14 DIAGNOSIS — H401133 Primary open-angle glaucoma, bilateral, severe stage: Secondary | ICD-10-CM | POA: Diagnosis not present

## 2020-08-14 DIAGNOSIS — Z7952 Long term (current) use of systemic steroids: Secondary | ICD-10-CM | POA: Diagnosis not present

## 2020-08-14 DIAGNOSIS — Z79899 Other long term (current) drug therapy: Secondary | ICD-10-CM | POA: Diagnosis not present

## 2020-09-16 DIAGNOSIS — E1142 Type 2 diabetes mellitus with diabetic polyneuropathy: Secondary | ICD-10-CM | POA: Diagnosis not present

## 2020-09-16 DIAGNOSIS — I10 Essential (primary) hypertension: Secondary | ICD-10-CM | POA: Diagnosis not present

## 2020-09-16 DIAGNOSIS — N1831 Chronic kidney disease, stage 3a: Secondary | ICD-10-CM | POA: Diagnosis not present

## 2020-09-16 DIAGNOSIS — G4709 Other insomnia: Secondary | ICD-10-CM | POA: Diagnosis not present

## 2020-09-16 DIAGNOSIS — Z794 Long term (current) use of insulin: Secondary | ICD-10-CM | POA: Diagnosis not present

## 2020-09-16 DIAGNOSIS — E7800 Pure hypercholesterolemia, unspecified: Secondary | ICD-10-CM | POA: Diagnosis not present

## 2020-09-16 DIAGNOSIS — E1165 Type 2 diabetes mellitus with hyperglycemia: Secondary | ICD-10-CM | POA: Diagnosis not present

## 2020-09-16 DIAGNOSIS — M25552 Pain in left hip: Secondary | ICD-10-CM | POA: Diagnosis not present

## 2020-11-19 DIAGNOSIS — H401133 Primary open-angle glaucoma, bilateral, severe stage: Secondary | ICD-10-CM | POA: Diagnosis not present

## 2020-11-19 DIAGNOSIS — Z9842 Cataract extraction status, left eye: Secondary | ICD-10-CM | POA: Diagnosis not present

## 2020-11-19 DIAGNOSIS — H26492 Other secondary cataract, left eye: Secondary | ICD-10-CM | POA: Diagnosis not present

## 2020-11-19 DIAGNOSIS — Z961 Presence of intraocular lens: Secondary | ICD-10-CM | POA: Diagnosis not present

## 2020-11-27 DIAGNOSIS — E1142 Type 2 diabetes mellitus with diabetic polyneuropathy: Secondary | ICD-10-CM | POA: Diagnosis not present

## 2020-11-27 DIAGNOSIS — E7800 Pure hypercholesterolemia, unspecified: Secondary | ICD-10-CM | POA: Diagnosis not present

## 2020-11-27 DIAGNOSIS — Z0000 Encounter for general adult medical examination without abnormal findings: Secondary | ICD-10-CM | POA: Diagnosis not present

## 2020-11-27 DIAGNOSIS — N1831 Chronic kidney disease, stage 3a: Secondary | ICD-10-CM | POA: Diagnosis not present

## 2020-11-27 DIAGNOSIS — I10 Essential (primary) hypertension: Secondary | ICD-10-CM | POA: Diagnosis not present

## 2020-11-27 DIAGNOSIS — E1165 Type 2 diabetes mellitus with hyperglycemia: Secondary | ICD-10-CM | POA: Diagnosis not present

## 2020-11-27 DIAGNOSIS — G4709 Other insomnia: Secondary | ICD-10-CM | POA: Diagnosis not present

## 2020-11-27 DIAGNOSIS — H401133 Primary open-angle glaucoma, bilateral, severe stage: Secondary | ICD-10-CM | POA: Diagnosis not present

## 2020-11-27 DIAGNOSIS — Z794 Long term (current) use of insulin: Secondary | ICD-10-CM | POA: Diagnosis not present

## 2021-02-03 DIAGNOSIS — I10 Essential (primary) hypertension: Secondary | ICD-10-CM | POA: Diagnosis not present

## 2021-02-03 DIAGNOSIS — Z794 Long term (current) use of insulin: Secondary | ICD-10-CM | POA: Diagnosis not present

## 2021-02-03 DIAGNOSIS — E7800 Pure hypercholesterolemia, unspecified: Secondary | ICD-10-CM | POA: Diagnosis not present

## 2021-02-03 DIAGNOSIS — Z0000 Encounter for general adult medical examination without abnormal findings: Secondary | ICD-10-CM | POA: Diagnosis not present

## 2021-02-03 DIAGNOSIS — E1165 Type 2 diabetes mellitus with hyperglycemia: Secondary | ICD-10-CM | POA: Diagnosis not present

## 2021-02-03 DIAGNOSIS — E1142 Type 2 diabetes mellitus with diabetic polyneuropathy: Secondary | ICD-10-CM | POA: Diagnosis not present

## 2021-03-02 DIAGNOSIS — H401133 Primary open-angle glaucoma, bilateral, severe stage: Secondary | ICD-10-CM | POA: Diagnosis not present

## 2021-03-31 DIAGNOSIS — E7800 Pure hypercholesterolemia, unspecified: Secondary | ICD-10-CM | POA: Diagnosis not present

## 2021-03-31 DIAGNOSIS — E1142 Type 2 diabetes mellitus with diabetic polyneuropathy: Secondary | ICD-10-CM | POA: Diagnosis not present

## 2021-03-31 DIAGNOSIS — I10 Essential (primary) hypertension: Secondary | ICD-10-CM | POA: Diagnosis not present

## 2021-03-31 DIAGNOSIS — M7062 Trochanteric bursitis, left hip: Secondary | ICD-10-CM | POA: Diagnosis not present

## 2021-03-31 DIAGNOSIS — Z794 Long term (current) use of insulin: Secondary | ICD-10-CM | POA: Diagnosis not present

## 2021-03-31 DIAGNOSIS — E1165 Type 2 diabetes mellitus with hyperglycemia: Secondary | ICD-10-CM | POA: Diagnosis not present

## 2021-05-11 DIAGNOSIS — E1165 Type 2 diabetes mellitus with hyperglycemia: Secondary | ICD-10-CM | POA: Diagnosis not present

## 2021-05-11 DIAGNOSIS — I10 Essential (primary) hypertension: Secondary | ICD-10-CM | POA: Diagnosis not present

## 2021-05-11 DIAGNOSIS — E1142 Type 2 diabetes mellitus with diabetic polyneuropathy: Secondary | ICD-10-CM | POA: Diagnosis not present

## 2021-05-11 DIAGNOSIS — E7800 Pure hypercholesterolemia, unspecified: Secondary | ICD-10-CM | POA: Diagnosis not present

## 2021-05-11 DIAGNOSIS — Z794 Long term (current) use of insulin: Secondary | ICD-10-CM | POA: Diagnosis not present

## 2021-05-18 DIAGNOSIS — H401133 Primary open-angle glaucoma, bilateral, severe stage: Secondary | ICD-10-CM | POA: Diagnosis not present

## 2021-06-09 DIAGNOSIS — N1831 Chronic kidney disease, stage 3a: Secondary | ICD-10-CM | POA: Diagnosis not present

## 2021-06-09 DIAGNOSIS — E1165 Type 2 diabetes mellitus with hyperglycemia: Secondary | ICD-10-CM | POA: Diagnosis not present

## 2021-06-09 DIAGNOSIS — E1142 Type 2 diabetes mellitus with diabetic polyneuropathy: Secondary | ICD-10-CM | POA: Diagnosis not present

## 2021-06-09 DIAGNOSIS — I10 Essential (primary) hypertension: Secondary | ICD-10-CM | POA: Diagnosis not present

## 2021-06-09 DIAGNOSIS — E7800 Pure hypercholesterolemia, unspecified: Secondary | ICD-10-CM | POA: Diagnosis not present

## 2021-06-18 DIAGNOSIS — E1165 Type 2 diabetes mellitus with hyperglycemia: Secondary | ICD-10-CM | POA: Diagnosis not present

## 2021-07-29 DIAGNOSIS — H401133 Primary open-angle glaucoma, bilateral, severe stage: Secondary | ICD-10-CM | POA: Diagnosis not present

## 2021-08-13 DIAGNOSIS — E1142 Type 2 diabetes mellitus with diabetic polyneuropathy: Secondary | ICD-10-CM | POA: Diagnosis not present

## 2021-08-13 DIAGNOSIS — I10 Essential (primary) hypertension: Secondary | ICD-10-CM | POA: Diagnosis not present

## 2021-08-13 DIAGNOSIS — E7800 Pure hypercholesterolemia, unspecified: Secondary | ICD-10-CM | POA: Diagnosis not present

## 2021-08-13 DIAGNOSIS — N1831 Chronic kidney disease, stage 3a: Secondary | ICD-10-CM | POA: Diagnosis not present

## 2021-10-01 DIAGNOSIS — H401133 Primary open-angle glaucoma, bilateral, severe stage: Secondary | ICD-10-CM | POA: Diagnosis not present

## 2021-10-28 DIAGNOSIS — H401133 Primary open-angle glaucoma, bilateral, severe stage: Secondary | ICD-10-CM | POA: Diagnosis not present

## 2021-11-13 DIAGNOSIS — I10 Essential (primary) hypertension: Secondary | ICD-10-CM | POA: Diagnosis not present

## 2021-11-13 DIAGNOSIS — N1831 Chronic kidney disease, stage 3a: Secondary | ICD-10-CM | POA: Diagnosis not present

## 2021-11-13 DIAGNOSIS — R0981 Nasal congestion: Secondary | ICD-10-CM | POA: Diagnosis not present

## 2021-11-13 DIAGNOSIS — E7800 Pure hypercholesterolemia, unspecified: Secondary | ICD-10-CM | POA: Diagnosis not present

## 2021-11-13 DIAGNOSIS — R600 Localized edema: Secondary | ICD-10-CM | POA: Diagnosis not present

## 2021-11-13 DIAGNOSIS — E1142 Type 2 diabetes mellitus with diabetic polyneuropathy: Secondary | ICD-10-CM | POA: Diagnosis not present

## 2021-11-13 DIAGNOSIS — Z0000 Encounter for general adult medical examination without abnormal findings: Secondary | ICD-10-CM | POA: Diagnosis not present

## 2022-01-28 DIAGNOSIS — H401133 Primary open-angle glaucoma, bilateral, severe stage: Secondary | ICD-10-CM | POA: Diagnosis not present

## 2022-02-12 DIAGNOSIS — I10 Essential (primary) hypertension: Secondary | ICD-10-CM | POA: Diagnosis not present

## 2022-02-12 DIAGNOSIS — E1142 Type 2 diabetes mellitus with diabetic polyneuropathy: Secondary | ICD-10-CM | POA: Diagnosis not present

## 2022-02-12 DIAGNOSIS — J40 Bronchitis, not specified as acute or chronic: Secondary | ICD-10-CM | POA: Diagnosis not present

## 2022-02-12 DIAGNOSIS — E1140 Type 2 diabetes mellitus with diabetic neuropathy, unspecified: Secondary | ICD-10-CM | POA: Diagnosis not present

## 2022-02-25 DIAGNOSIS — H401133 Primary open-angle glaucoma, bilateral, severe stage: Secondary | ICD-10-CM | POA: Diagnosis not present

## 2022-03-05 DIAGNOSIS — N1831 Chronic kidney disease, stage 3a: Secondary | ICD-10-CM | POA: Diagnosis not present

## 2022-04-13 DIAGNOSIS — I10 Essential (primary) hypertension: Secondary | ICD-10-CM | POA: Diagnosis not present

## 2022-04-13 DIAGNOSIS — H401133 Primary open-angle glaucoma, bilateral, severe stage: Secondary | ICD-10-CM | POA: Diagnosis not present

## 2022-04-13 DIAGNOSIS — H401122 Primary open-angle glaucoma, left eye, moderate stage: Secondary | ICD-10-CM | POA: Diagnosis not present

## 2022-04-13 DIAGNOSIS — H401123 Primary open-angle glaucoma, left eye, severe stage: Secondary | ICD-10-CM | POA: Diagnosis not present

## 2022-04-14 DIAGNOSIS — Z48810 Encounter for surgical aftercare following surgery on the sense organs: Secondary | ICD-10-CM | POA: Diagnosis not present

## 2022-04-14 DIAGNOSIS — H401133 Primary open-angle glaucoma, bilateral, severe stage: Secondary | ICD-10-CM | POA: Diagnosis not present

## 2022-04-14 DIAGNOSIS — Z79899 Other long term (current) drug therapy: Secondary | ICD-10-CM | POA: Diagnosis not present

## 2022-08-07 NOTE — Progress Notes (Signed)
 Ophthalmology Clinic Note   CHIEF COMPLAINT Glaucoma Evaluation  HPI    Glaucoma Evaluation          Visit Type: a return visit   Laterality: both eyes   Type: primary open angle glaucoma   Glaucoma Medications: currently taking glaucoma medications (see med list)   Compliance with Treatment: always complies with treatment   Tolerating Drops Well: reports they are tolerating drops well   Patient requests new MRx: does not request a new glasses prescription   Patient needs eye medication refill: does not need an eye medication refill   Review of Eye Symptoms (details in comments): Negative for flashes, floaters and eye pain       Comments   Primary open angle glaucoma of both eyes, severe stage: Vision seems to be stable since last visit and no changes noticed since last visit.      Last edited by Ezzard Stephane SAUNDERS, COA on 08/09/2022 11:11 AM.     HISTORY OF PRESENT ILLNESSES Joann Anderson is a 89 y.o. female   Bleb revision via needling/mmc OS 04/13/2022 - bb (for IOP 16) Phaco/trab/mmc OS 08/05/2020 - mg, bb (for IOP 15) LSL temporal suture 08/14/2020 (for IOP 17) Referred by Dr. Cleatus  POAG OU, severe OU  Thin corneas (507 OD, 495 OS) Untreated IOP from 2007 - OD 21, OS 29  PC IOL OD (2007) - Hecker  Ptosis OU  Humphrey visual field interpretation  10-2 OS   10/01/2021: Good reliability OS OS: 10-2:  Extinguished within central 10 degrees - MD -31.6  (no more visual fields)  Humphrey visual field interpretation  10-2 OU  06/30/2017: Good reliability OU OD: Infero-nasal island remaining within central 10 degrees -  MD -27.8; maybe worse OS: Central and nasal islands remaining within central 10 degrees -  MD -22.0; maybe worse  Humphrey visual field interpretation  10-2 OU  08/18/2016: Good reliability OU OD: Inferior nasal island remaining within central 10 degrees - MD -22.4; stable OS: Central and inferior nasal defect within central 10 degrees- MD -20.3; probably  stable  Humphrey visual field interpretation 10-2  07/25/2015: Good reliability OU OD:  Loss in both hemifields of central 10 degrees - MD-23.7; stable compared to 2014 OS:  Loss in both hemifields of central 10 degrees - MD-17.7; stable compared to 2015  Humphrey visual field interpretation 10-2 OU 02/08/2014: Good reliability OU OD: Arcuate defect in both hemifields of central 10 OS: Arcuate defect in both hemifields of central 10  Humphrey visual field interpretation 01/18/2013:  Good reliability OU  OD: Central island remaining on 10-2  OS: Superior arcuate defect, Inferior arcuate defect - MD-10   VFs from outside records:  [facility] 05/2012 (with Dr. Cleatus): OD VFI 42, MD-17.6, loss in both hemifields  OS VFI 71, MD -11.6, loss in both hemifields   [facility] 04/2006: OD loss both hemifelds, MD - 14    REVIEW OF SYSTEMS  Lewis, Dawn R, COA  08/09/2022 11:07 AM  Sign when Signing Visit Review of Systems  Constitutional: Negative for fever.  HENT: Negative for sore throat.   Electronically signed by: Stephane SAUNDERS Ezzard, COA 08/09/2022 11:07 AM     MEDICATIONS Outpatient Encounter Medications as of 08/09/2022  Medication Sig Dispense Refill  . dorzolamide-timoloL (COSOPT) 22.3-6.8 mg/mL ophthalmic solution Place 1 drop into the right eye 2 times daily. 10 mL 3  . amLODIPine (NORVASC) 10 MG tablet Take 1 tablet (10 mg total) by mouth nightly.    SABRA  ascorbic acid, vitamin C, (VITAMIN C) 1000 MG tablet Take 1 tablet (1,000 mg total) by mouth daily.    SABRA atorvastatin (LIPITOR) 20 MG tablet Take 1 tablet (20 mg total) by mouth daily.    . bimatoprost (LUMIGAN) 0.03 % ophthalmic drops Place 1 drop into the right eye nightly.    . ergocalciferol, vitamin D2, (VITAMIN D ORAL) Take 1 capsule by mouth daily.    SABRA HUMULIN 70/30 U-100 INSULIN 100 unit/mL (70-30) injection Inject 20 Units into the skin every morning. And 10 units at bedtime    . LYRICA 25 mg capsule Take 1 capsule (25 mg total) by mouth  as needed.    . multivit-min-iron-FA-lutein (CENTRUM SILVER WOMEN) 8 mg iron-400 mcg-300 mcg Tab Take 1 tablet by mouth daily.    . prednisoLONE acetate (PRED FORTE) 1 % ophthalmic suspension Place 1 drop into the left eye 3 times daily. 10 mL 6  . valsartan-hydrochlorothiazide (DIOVAN-HCT) 160-12.5 mg per tablet Take 1 tablet by mouth every morning.     No facility-administered encounter medications on file as of 08/09/2022.    SOCIAL HISTORY Social History   Tobacco Use  . Smoking status: Never  . Smokeless tobacco: Never  Substance Use Topics  . Alcohol use: Never    OPHTHALMIC EXAM  Base Eye Exam    Visual Acuity (Snellen - Linear)      Right Left   Dist Humphreys LP 20/80 -2       Tonometry (Applanation, 11:46 AM)      Right Left   Pressure 38 9       Pupils      APD   Right +   Left None       Neuro/Psych    Oriented x3: Yes   Mood/Affect: Normal        Slit Lamp and Fundus Exam    Slit Lamp Exam      Right Left   Lids/Lashes Ptosis Mild ptosis   Conjunctiva/Sclera 1+ vessels  Diffuse low bleb - 1e, 1i 4h, posterior   Cornea Clear  Clear   Anterior Chamber Deep and quiet Deep and quiet   Iris Round and reactive PI   Lens PC IOL PC IOL          ASSESSMENT 1. Primary open angle glaucoma of both eyes, severe stage       ~3 months after bleb needling OS 04/13/22 LP OD, Goal of treatment = comfort Looks great - IOP 9 off glaucoma drops OS Continue drop the same.  Recheck in 2 months   PLAN Dorzolamide-Timolol x2 OD  Return in about 2 months (around 10/08/2022).    There were no meds ordered this visit.    Patient Instructions   DAILY DRUG REMINDER  Wait 10 minutes between drops!!! (Keep eyes closed for 2 MINUTES after each drop)   Drug EYE R = Right L = Left B = Both Breakfast Lunch Supper Bedtime Time Frame  Dorzolamide-Timolol *Cosopt* (Dark blue top)  R X  X  Take about 12 hours apart.          HOW TO TAKE EYEDROPS 1.   Make sure  you understand when, and exactly how often, to take your eyedrop medicine.  Preferably, learn the names of your medicines, or at least be able to identify your eyedrops by the color of the cap of the bottle (e.g., "I take the purple top 2 times a day in the right eye and the yellow  top 1 time a day in both eyes."). 2.  Always wash your hands prior to instilling your eyedrops. 3.  If you are putting drops in both eyes, then sit down or lie down to put in your drops.  This is because it is important to close the eye getting drops for 2 minutes immediately after instilling the drop (this markedly improves absorption).  If you are putting the drops in both eyes, then you will be closing both eyes for at least 2 minutes, therefore you should be sitting or lying down.  If you must stand to put in the drops (need to look in mirror), then have a chair next to you so that you can sit just after instilling the drops and close your eyes for 2 minutes.  If you are only instilling an eyedrop in one eye, then you only have to close that eye after the drop, and therefore you don't necessarily have to sit or lie. 4.  If you are taking more than one drop at a time in the same eye, then you must separate the drops by at least 10 minutes. 5.  To put in the drop, hold the bottle in your dominant hand between your forefinger and thumb and invert the bottle over the targeted eye.  Your head should be tilted back and you should roll your eyes upward. 6.  With your other hand, pull the lower eyelid down and try to instill the drop in the "pouch" made by pulling the lid down, or on the lower part of the eye ball.  Position the bottle close to the eye, but do not touch the eye with the dropper tip.  Squeeze the sides of the bottle and a drop of the medicine will come out. 7.  You will feel the drop when it contacts the eye, and then you should immediately close the eye, or if you are putting the drop in both eyes, move your hands  immediately to the other eye, and place the drop in it, and then close both eyes as recommended in #2 above. 8.  While the eye/s are closed, use a tissue to wipe away any medication that remains on the lid or lashes.  You may do this during the 2 minutes that the eye remains closed, just do not open the eye to wipe it. 9.  Always remember to put the cap back on the bottle and store the bottle away from pets and not in direct sunlight.  The bottle does not have to be refrigerated, but it may be if that is preferred (some people prefer feeling the cooled eyedrop on the eye). 10.  Never stop the drops without conferring with your doctor first, even if you feel that the drops are not helping your eye.  If you believe you are experiencing side-effects from the eyedrops, then discuss that, or any issues or concerns,  with your doctor.     This document serves as a record of services personally performed by J. Thresa Bracket, MD. It was created on their behalf by Charmaine A. Georgina, a trained medical scribe. The creation of this record is the provider's dictation and/or activities during the visit.  I agree the documentation is accurate and complete.  Electronically signed by: JINNY Thresa Bracket, MD 08/13/2022 4:26 PM     Electronically signed by: Bracket Reyes Thresa, MD 08/13/22 [phone]

## 2022-08-09 DIAGNOSIS — H401133 Primary open-angle glaucoma, bilateral, severe stage: Secondary | ICD-10-CM | POA: Diagnosis not present

## 2022-08-31 DIAGNOSIS — H43393 Other vitreous opacities, bilateral: Secondary | ICD-10-CM | POA: Diagnosis not present

## 2022-08-31 DIAGNOSIS — H40033 Anatomical narrow angle, bilateral: Secondary | ICD-10-CM | POA: Diagnosis not present

## 2022-09-07 DIAGNOSIS — E119 Type 2 diabetes mellitus without complications: Secondary | ICD-10-CM | POA: Diagnosis not present

## 2022-09-07 DIAGNOSIS — H3581 Retinal edema: Secondary | ICD-10-CM | POA: Diagnosis not present

## 2022-09-07 DIAGNOSIS — H401133 Primary open-angle glaucoma, bilateral, severe stage: Secondary | ICD-10-CM | POA: Diagnosis not present

## 2022-09-07 DIAGNOSIS — Z961 Presence of intraocular lens: Secondary | ICD-10-CM | POA: Diagnosis not present

## 2022-09-08 DIAGNOSIS — H5371 Glare sensitivity: Secondary | ICD-10-CM | POA: Diagnosis not present

## 2022-10-11 DIAGNOSIS — H401133 Primary open-angle glaucoma, bilateral, severe stage: Secondary | ICD-10-CM | POA: Diagnosis not present

## 2022-11-23 DIAGNOSIS — E559 Vitamin D deficiency, unspecified: Secondary | ICD-10-CM | POA: Diagnosis not present

## 2022-11-23 DIAGNOSIS — E1122 Type 2 diabetes mellitus with diabetic chronic kidney disease: Secondary | ICD-10-CM | POA: Diagnosis not present

## 2022-11-23 DIAGNOSIS — Z79899 Other long term (current) drug therapy: Secondary | ICD-10-CM | POA: Diagnosis not present

## 2022-11-23 DIAGNOSIS — N1831 Chronic kidney disease, stage 3a: Secondary | ICD-10-CM | POA: Diagnosis not present

## 2022-11-23 DIAGNOSIS — E1142 Type 2 diabetes mellitus with diabetic polyneuropathy: Secondary | ICD-10-CM | POA: Diagnosis not present

## 2022-11-23 DIAGNOSIS — Z794 Long term (current) use of insulin: Secondary | ICD-10-CM | POA: Diagnosis not present

## 2022-11-23 DIAGNOSIS — R200 Anesthesia of skin: Secondary | ICD-10-CM | POA: Diagnosis not present

## 2022-11-23 DIAGNOSIS — E11319 Type 2 diabetes mellitus with unspecified diabetic retinopathy without macular edema: Secondary | ICD-10-CM | POA: Diagnosis not present

## 2022-11-23 DIAGNOSIS — E7800 Pure hypercholesterolemia, unspecified: Secondary | ICD-10-CM | POA: Diagnosis not present

## 2022-11-23 DIAGNOSIS — Z780 Asymptomatic menopausal state: Secondary | ICD-10-CM | POA: Diagnosis not present

## 2022-11-23 DIAGNOSIS — Z0000 Encounter for general adult medical examination without abnormal findings: Secondary | ICD-10-CM | POA: Diagnosis not present

## 2022-12-01 DIAGNOSIS — M859 Disorder of bone density and structure, unspecified: Secondary | ICD-10-CM | POA: Diagnosis not present

## 2022-12-01 DIAGNOSIS — Z780 Asymptomatic menopausal state: Secondary | ICD-10-CM | POA: Diagnosis not present

## 2022-12-01 DIAGNOSIS — Z853 Personal history of malignant neoplasm of breast: Secondary | ICD-10-CM | POA: Diagnosis not present

## 2022-12-14 DIAGNOSIS — E119 Type 2 diabetes mellitus without complications: Secondary | ICD-10-CM | POA: Diagnosis not present

## 2022-12-14 DIAGNOSIS — Z961 Presence of intraocular lens: Secondary | ICD-10-CM | POA: Diagnosis not present

## 2022-12-14 DIAGNOSIS — H401133 Primary open-angle glaucoma, bilateral, severe stage: Secondary | ICD-10-CM | POA: Diagnosis not present

## 2022-12-14 DIAGNOSIS — H3581 Retinal edema: Secondary | ICD-10-CM | POA: Diagnosis not present

## 2023-03-24 DIAGNOSIS — H401133 Primary open-angle glaucoma, bilateral, severe stage: Secondary | ICD-10-CM | POA: Diagnosis not present

## 2023-03-24 NOTE — Progress Notes (Signed)
 Ophthalmology Clinic Note    HISTORY OF PRESENT ILLNESSES  Bleb revision via needling/mmc OS 04/13/2022 - bb (for IOP 16) Phaco/trab/mmc OS 08/05/2020 - mg, bb (for IOP 15) LSL temporal suture 08/14/2020 (for IOP 17) Referred by Dr. Cleatus   POAG OU, severe OU   Thin corneas (507 OD, 495 OS) Untreated IOP from 2007 - OD 21, OS 29   PC IOL OD (2007) - Hecker   Ptosis OU  Humphrey visual field interpretation  10-2 OS   10/01/2021: Good reliability OS OS: 10-2:  Extinguished within central 10 degrees - MD -31.6  (no more visual fields)  Humphrey visual field interpretation  10-2 OU  06/30/2017: Good reliability OU OD: Infero-nasal island remaining within central 10 degrees -  MD -27.8; maybe worse OS: Central and nasal islands remaining within central 10 degrees -  MD -22.0; maybe worse  Humphrey visual field interpretation  10-2 OU  08/18/2016: Good reliability OU OD: Inferior nasal island remaining within central 10 degrees - MD -22.4; stable OS: Central and inferior nasal defect within central 10 degrees- MD -20.3; probably stable  Humphrey visual field interpretation 10-2  07/25/2015: Good reliability OU OD:  Loss in both hemifields of central 10 degrees - MD-23.7; stable compared to 2014 OS:  Loss in both hemifields of central 10 degrees - MD-17.7; stable compared to 2015  Humphrey visual field interpretation 10-2 OU 02/08/2014: Good reliability OU OD: Arcuate defect in both hemifields of central 10 OS: Arcuate defect in both hemifields of central 10  Humphrey visual field interpretation 01/18/2013:   Good reliability OU   OD: Central island remaining on 10-2   OS: Superior arcuate defect, Inferior arcuate defect - MD-10   VFs from outside records:   [facility] 05/2012 (with Dr. Cleatus): OD VFI 42, MD-17.6, loss in both hemifields   OS VFI 71, MD -11.6, loss in both hemifields   [facility] 04/2006: OD loss both hemifelds, MD - 14    ASSESSMENT 1. Primary open angle glaucoma (POAG) of  both eyes, severe stage       Patient is tolerating and taking medication as prescribed. NLP OD, Goal of treatment = comfort IOP good OS Continue drops the same Recheck in 4 months Schedule with optom   PLAN Dorzolamide-Timolol x2 OD  Recommends Ats Reviewed proper drop installation technique Return in about 4 months (around 07/24/2023).  This document serves as a record of services personally performed by J. Thresa Bracket, MD . It was created on their behalf by Dennison Camp, a trained medical scribe. The creation of this record is the provider's dictation and/or activities during the visit I agree the documentation is accurate and complete.  Electronically signed by: JINNY Thresa Bracket, MD 03/28/2023 12:18 AM

## 2023-04-15 DIAGNOSIS — H524 Presbyopia: Secondary | ICD-10-CM | POA: Diagnosis not present

## 2023-05-13 ENCOUNTER — Other Ambulatory Visit: Payer: Self-pay | Admitting: Internal Medicine

## 2023-05-13 ENCOUNTER — Ambulatory Visit
Admission: RE | Admit: 2023-05-13 | Discharge: 2023-05-13 | Disposition: A | Discharge status: Home / Self Care | Payer: Medicare Other | Source: Ambulatory Visit | Attending: Internal Medicine | Admitting: Internal Medicine

## 2023-05-13 DIAGNOSIS — N1831 Chronic kidney disease, stage 3a: Secondary | ICD-10-CM | POA: Diagnosis not present

## 2023-05-13 DIAGNOSIS — M25551 Pain in right hip: Secondary | ICD-10-CM

## 2023-05-13 DIAGNOSIS — I10 Essential (primary) hypertension: Secondary | ICD-10-CM | POA: Diagnosis not present

## 2023-05-13 DIAGNOSIS — Z9181 History of falling: Secondary | ICD-10-CM | POA: Diagnosis not present

## 2023-07-24 NOTE — Progress Notes (Signed)
 Ophthalmology Department Clinical Visit Note     CHIEF COMPLAINT Patient presents for Glaucoma   HISTORY OF PRESENT ILLNESS: Joann Anderson is a 89 y.o. old female who presents to the clinic today for:   HPI     Glaucoma   The patient is here for a return visit.  The patient is currently taking glaucoma medications (Dorzolamide-Timolol x2 OD ).  The patient always complies with treatment.  The patient reports they are tolerating drops well.  Associated Symptoms:: Vision appears stable. No discomfort or irritation..  The patient does not need an eye medication refill.      Last edited by Starla GORMAN Larve, COA on 07/28/2023 10:25 AM.     Bleb revision via needling/mmc OS 04/13/2022 - bb (for IOP 16) Phaco/trab/mmc OS 08/05/2020 - mg, bb (for IOP 15) LSL temporal suture 08/14/2020 (for IOP 17) Referred by Dr. Cleatus   POAG OU, severe OU   Thin corneas (507 OD, 495 OS) Untreated IOP from 2007 - OD 21, OS 29   PC IOL OD (2007) - Hecker   Ptosis OU  Humphrey visual field interpretation  10-2 OS   10/01/2021: Good reliability OS OS: 10-2:  Extinguished within central 10 degrees - MD -31.6  (no more visual fields)  Humphrey visual field interpretation  10-2 OU  06/30/2017: Good reliability OU OD: Infero-nasal island remaining within central 10 degrees -  MD -27.8; maybe worse OS: Central and nasal islands remaining within central 10 degrees -  MD -22.0; maybe worse  Humphrey visual field interpretation  10-2 OU  08/18/2016: Good reliability OU OD: Inferior nasal island remaining within central 10 degrees - MD -22.4; stable OS: Central and inferior nasal defect within central 10 degrees- MD -20.3; probably stable  Humphrey visual field interpretation 10-2  07/25/2015: Good reliability OU OD:  Loss in both hemifields of central 10 degrees - MD-23.7; stable compared to 2014 OS:  Loss in both hemifields of central 10 degrees - MD-17.7; stable compared to 2015  Humphrey visual  field interpretation 10-2 OU 02/08/2014: Good reliability OU OD: Arcuate defect in both hemifields of central 10 OS: Arcuate defect in both hemifields of central 10  Humphrey visual field interpretation 01/18/2013:   Good reliability OU   OD: Central island remaining on 10-2   OS: Superior arcuate defect, Inferior arcuate defect - MD-10   VFs from outside records:   [facility] 05/2012 (with Dr. Cleatus): OD VFI 42, MD-17.6, loss in both hemifields   OS VFI 71, MD -11.6, loss in both hemifields   [facility] 04/2006: OD loss both hemifelds, MD - 14    CURRENT MEDICATIONS: Current Outpatient Medications on File Prior to Visit (Ophthalmic Drugs)  Medication Sig  . artificial tears, hypromellose, (GENTEAL TEARS) 0.3 % gel Apply 1 drop to left eye 2 (two) times a day.  . bimatoprost (LUMIGAN) 0.03 % drop ophthalmic solution Administer 1 drop into the right eye nightly.  . dorzolamide (TRUSOPT) 2 % ophthalmic solution Administer 1 drop into the right eye 2 (two) times a day.  . dorzolamide-timoloL (COSOPT) 22.3-6.8 mg/mL ophthalmic solution Administer 1 drop into the right eye 2 (two) times a day.  . prednisoLONE acetate (PRED FORTE) 1 % ophthalmic suspension Administer 1 drop into left eye 3 (three) times a day.  . timolol (TIMOPTIC) 0.5 % ophthalmic solution Administer 1 drop into the right eye 2 (two) times a day.   Current Outpatient Medications on File Prior to Visit (Other)  Medication Sig  .  amLODIPine (NORVASC) 10 mg tablet Take 10 mg by mouth nightly.  SABRA ascorbic acid (VITAMIN C) 1,000 mg tablet Take 1,000 mg by mouth Once Daily.  SABRA atorvastatin (LIPITOR) 20 mg tablet Take 20 mg by mouth Once Daily.  . cholecalciferol (VITAMIN D3) 10 mcg (400 unit) tablet Take 1 capsule by mouth Once Daily.  . HumuLIN 70/30 U-100 Insulin 100 unit/mL (70-30) injection Inject 20 Units under the skin every morning.  . multivit-min-iron-FA-vit K-lut (Centrum Silver Women) 8 mg iron-400 mcg-50 mcg tab Take 1 tablet by  mouth Once Daily.  . pregabalin (Lyrica) 25 mg capsule Take 25 mg by mouth as needed.  . valsartan-hydroCHLOROthiazide (DIOVAN HCT) 160-12.5 mg per tablet Take 1 tablet by mouth every morning.    Referring physician: No referring provider defined for this encounter.  ALLERGIES Allergies  Allergen Reactions  . Aspirin Other (See Comments)    Causes bleeding  . Quine Rash  . Sulfamethoxazole Rash    PAST MEDICAL HISTORY Past Medical History:  Diagnosis Date  . Breast cancer, left breast (CMD) 1985   s/p left lump & chemo  . Cataract, nuclear sclerotic senile, left 06/04/2020  . Dementia (CMD)    I reviewed 06/19/2020 PCP notes that included Namenda & Aricept as active meds, however as of 07/30/2020 patient denies taking either med  . Essential hypertension 07/30/2020  . 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 Peripheral edema   . Polyneuropathy due to type 2 diabetes mellitus (CMD)   . Primary open angle glaucoma of both eyes, severe stage 05/15/2012   Dr. Thresa Bracket  . Pseudophakia of right eye 2007   Dr. Cleatus  . Ptosis of eyelid 07/02/2013  . Recurrent major depression in remission (CMD)   . Stage 3a chronic kidney disease (CMD)    GFR 57 Scr 1.01 K+ 4.5 on 07/01/2020  . Type 2 diabetes mellitus (CMD)    A1C 7.2 on 07/01/2020   Past Surgical History:  Procedure Laterality Date  . ABDOMINAL HYSTERECTOMY     Procedure: ABDOMINAL HYSTERECTOMY  . BREAST LUMPECTOMY Left 1985   Procedure: BREAST LUMPECTOMY  . CATARACT EXTRACTION W/  INTRAOCULAR LENS IMPLANT Right 2007   Procedure: CATARACT EXTRACTION W/  INTRAOCULAR LENS IMPLANT; Dr. Cleatus in [place]  . CATARACT EXTRACTION W/  INTRAOCULAR LENS IMPLANT Left 08/05/2020   Procedure: PHACOEMULSIFICATION PC / IOL GMO;  Surgeon: Donnice Sickles, MD;  Location[facility] OUTPATIENT OR;  Service: Ophthalmology;  Laterality: Left;  . GLAUCOMA SURGERY Left 04/13/2022   Procedure: GLAUCOMA BLEB REVISION; Bleb revision via  needling with sub conj mitomycin  OS on 04/13/22  . GLAUCOMA SURGERY Left 04/13/2022   Procedure: BLEB REVISION via needling with mitomycin ;  Surgeon: Bracket Reyes Thresa, MD;  Location[facility] OUTPAT[facility];  Service: Ophthalmology;  Laterality: Left;  mitomycin   . TRABECULECTOMY Left 08/05/2020   Procedure: TRABECULECTOMY  . TRABECULECTOMY Left 08/05/2020   Procedure: TRABECULECTOMY;  Surgeon: Reyes Thresa Bracket, MD;  Location: [facility] OUTPATIENT OR;  Service: Ophthalmology;  Laterality: Left;  outpatient, combined with phaco by dr sickles, mitomycin     FAMILY HISTORY Family History  Problem Relation Name Age of Onset  . Glaucoma Sister    . Diabetes Sister    . Cataracts Sister    . Hypertension Sister    . Stroke Sister    . Amblyopia Neg Hx    . Blindness Neg Hx    . Cancer Neg Hx    . Macular degeneration Neg Hx    .  Retinal detachment Neg Hx    . Strabismus Neg Hx    . Thyroid  disease Neg Hx      SOCIAL HISTORY Social History   Tobacco Use  . Smoking status: Never  . Smokeless tobacco: Never  Substance Use Topics  . Alcohol use: Never        OPHTHALMIC EXAM:  Base Eye Exam     Visual Acuity (Snellen - Linear)       Right Left   Dist [place] LP 20/200 +2         Tonometry (Applanation, 11:16 AM)       Right Left   Pressure 31 8         Neuro/Psych     Oriented x3: Yes   Mood/Affect: Normal           Slit Lamp and Fundus Exam     Slit Lamp Exam       Right Left   Lids/Lashes Ptosis Mild ptosis   Conjunctiva/Sclera 1+ vessels  Diffuse low bleb - 3e, 2i 4h, posterior   Cornea Clear  Clear   Anterior Chamber Deep and quiet Deep and quiet   Iris Round and reactive PI   Lens PC IOL PC IOL            IMAGING AND PROCEDURES:  @EYRES @     ASSESSMENT 1. Primary open angle glaucoma (POAG) of both eyes, severe stage        Patient is tolerating and taking medication as prescribed. NLP OD, Goal of treatment = comfort IOP good  OS Continue drops the same Recheck in 5-6 months  PLAN Dorzolamide-Timolol x2 OD  Recommends ATs Reviewed proper drop installation technique Return in about 5 months (around 12/26/2023).  This document serves as a record of services personally performed by J. Thresa Bracket. It was created on their behalf by My Rayleen, a trained medical scribe. The creation of this record is the provider's dictation and/or activities during the visit. I agree the documentation is accurate and complete.  Electronically signed by: JINNY Thresa Bracket, MD 07/29/2023 8:46 PM

## 2023-07-28 DIAGNOSIS — H401133 Primary open-angle glaucoma, bilateral, severe stage: Secondary | ICD-10-CM | POA: Diagnosis not present

## 2023-10-20 NOTE — Telephone Encounter (Signed)
 Pt states left eye is still blurry, has been going on for about 3 weeks.  Per previous note    Called pt per message  [phone] Obtained full name and DOB. Dr Geneva pt   Pt states that Va was blurry, but its better, tried some drops.  Artifical tears.  Pt will call back if any changes Joann Anderson, COA  Pt states Artificial tears are not working.  Pt would ike to be seen in urgent care able to go to [facility] Friday 10/21/2023

## 2023-10-21 DIAGNOSIS — H401133 Primary open-angle glaucoma, bilateral, severe stage: Secondary | ICD-10-CM | POA: Diagnosis not present

## 2023-10-21 DIAGNOSIS — H04129 Dry eye syndrome of unspecified lacrimal gland: Secondary | ICD-10-CM | POA: Diagnosis not present

## 2023-10-21 DIAGNOSIS — H04123 Dry eye syndrome of bilateral lacrimal glands: Secondary | ICD-10-CM | POA: Diagnosis not present

## 2023-10-21 DIAGNOSIS — Z961 Presence of intraocular lens: Secondary | ICD-10-CM | POA: Diagnosis not present

## 2023-10-21 DIAGNOSIS — H348312 Tributary (branch) retinal vein occlusion, right eye, stable: Secondary | ICD-10-CM | POA: Diagnosis not present

## 2023-10-21 DIAGNOSIS — E119 Type 2 diabetes mellitus without complications: Secondary | ICD-10-CM | POA: Diagnosis not present

## 2023-10-21 NOTE — Progress Notes (Signed)
 Ophthalmology Department Urgent Visit Note  CHIEF COMPLAINT Blurred Vision   HISTORY OF PRESENT ILLNESS   Joann Anderson is a 89 y.o. 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 who presents to clinic for:   HPI     Blurred Vision   In left eye.  Onset was gradual.  Vision is blurred and fluctuating.  Severity is moderate.  It is worse throughout the day.  Treatments tried include artificial tears.        Comments   Pt complains of intermittent blurry vision left eye x 2-3 weeks.   She denies injury, pain, or discomfort.  She is using AT prn as advised by technician by phone with some improvement.      Last edited by Bernarda He Laurence, MD on 10/23/2023  8:06 PM.        Historical Information: Selected notes from the MEDICAL RECORD NUMBER Pt states left eye is still blurry, has been going on for about 3 weeks.   Per previous note     Called pt per message  [phone] Obtained full name and DOB. Dr Geneva pt   Pt states that Va was blurry, but its better, tried some drops.  Artifical tears.  Pt will call back if any changes Joann Anderson, COA   Pt states Artificial tears are not working.  Bond 07/2023 1. Primary open angle glaucoma (POAG) of both eyes, severe stage            Patient is tolerating and taking medication as prescribed. NLP OD, Goal of treatment = comfort IOP good OS Continue drops the same Recheck in 5-6 months    PLAN Dorzolamide-Timolol x2 OD  Recommends ATs Reviewed proper drop installation technique Return in about 5 months (around 12/26/2023).    Dr Maree 12/2022 1. Primary open-angle glaucoma, bilateral, severe stage - Under the excellent care of Dr. Geneva - s/p LSL temporal suture 08/14/20 - s/p phaco/trab/mmc OS 08/05/20 with Dr. Caresse and Dr. Geneva - s/p bleb revision via needling/mmc left eye 04/13/22 with Dr. Geneva.    2. Retinal edema - No retinal edema noted on imaging or exam - Monitor    3. Pseudophakia of both eyes  - Stable - Exam today 12/14/22 shows loose suture OS - removed at slit lamp - Start Vigamox QID OS for 7 days and then d/c.  - Recommend frequent ATs 6x day    4. Controlled type 2 diabetes mellitus without complication, without long-term current use of insulin  - I have stressed strict glycemic control with a goal A1c< 7.0 along with continued optimization of serum lipids, blood pressure , and avoiding cigarette or any type of tobacco, and the need for long term follow up was also discussed with patient. - Exam and imaging today 12/14/22 remains stable with no retinal edema OS - No treatment required at this time - I will monitor - Continue good glycemic control  - Follow up in 1 year for reevaluation     CURRENT MEDICATIONS:  Outpatient Encounter Medications as of 10/21/2023  Medication Sig Dispense Refill  . amLODIPine (NORVASC) 10 mg tablet Take 10 mg by mouth nightly.    SABRA artificial tears, hypromellose, (GENTEAL TEARS) 0.3 % gel Apply 1 drop to left eye 2 (two) times a day.    SABRA ascorbic acid (VITAMIN C) 1,000 mg tablet Take 1,000 mg by mouth Once Daily.    SABRA atorvastatin (LIPITOR) 20 mg tablet Take 20 mg by mouth Once Daily.    . bimatoprost (LUMIGAN) 0.03 % drop ophthalmic solution Administer 1 drop into the right eye nightly.    . cholecalciferol (VITAMIN D3) 10 mcg (400 unit) tablet Take 1 capsule by mouth Once Daily.    . dorzolamide-timoloL (COSOPT)  22.3-6.8 mg/mL ophthalmic solution Administer 1 drop into the right eye 2 (two) times a day. 10 mL 3  . HumuLIN 70/30 U-100 Insulin 100 unit/mL (70-30) injection Inject 20 Units under the skin every morning.    . multivit-min-iron-FA-vit K-lut (Centrum Silver Women) 8 mg iron-400 mcg-50 mcg tab Take 1 tablet by mouth Once Daily.    . prednisoLONE acetate (PRED FORTE) 1 % ophthalmic suspension Administer 1 drop into left eye 3 (three) times a day. 10 mL 6  . pregabalin (Lyrica) 25 mg capsule Take 25 mg by mouth as needed.    . valsartan-hydroCHLOROthiazide (DIOVAN HCT) 160-12.5 mg per tablet Take 1 tablet by mouth every morning.    . dorzolamide (TRUSOPT) 2 % ophthalmic solution Administer 1 drop into the right eye 2 (two) times a day. 10 mL 11  . timolol (TIMOPTIC) 0.5 % ophthalmic solution Administer 1 drop into the right eye 2 (two) times a day. 10 mL 11   No facility-administered encounter medications on file as of 10/21/2023.    Referring physician: Reyes Thresa Bracket, MD [facility] [place],  [place] [address]  REVIEW OF SYSTEMS: ROS   Positive for: Eyes Last edited by Almer Cindie Starling, COA on 10/21/2023 11:41 AM.      ALLERGIES Allergies  Allergen Reactions  . Aspirin Other (See Comments)    Causes bleeding  . Quine Rash  . Sulfamethoxazole Rash    PAST MEDICAL HISTORY Past Medical History:  Diagnosis Date  . Breast cancer, left breast    (CMD) 1985   s/p left lump & chemo  . Cataract, nuclear sclerotic senile, left 06/04/2020  . Dementia (CMD)    I reviewed 06/19/2020 PCP notes that included Namenda & Aricept as active meds, however as of 07/30/2020 patient denies taking either med  . Essential hypertension 07/30/2020  . 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 Peripheral edema   . Polyneuropathy due to type 2 diabetes mellitus    (CMD)   . Primary open angle glaucoma of both eyes, severe stage 05/15/2012   Dr. Thresa Bracket  . Pseudophakia of  right eye 2007   Dr. Cleatus  . Ptosis of eyelid 07/02/2013  . Recurrent major depression in remission   . Stage 3a chronic kidney disease (CMD)    GFR 57 Scr 1.01 K+ 4.5 on 07/01/2020  . Type 2 diabetes mellitus    (CMD)    A1C 7.2 on 07/01/2020   Past Surgical History:  Procedure Laterality Date  . ABDOMINAL HYSTERECTOMY     Procedure: ABDOMINAL HYSTERECTOMY  . BREAST LUMPECTOMY Left 1985   Procedure: BREAST LUMPECTOMY  . CATARACT EXTRACTION W/  INTRAOCULAR LENS IMPLANT Right 2007   Procedure: CATARACT EXTRACTION W/  INTRAOCULAR LENS  IMPLANT; Dr. Cleatus in [place]  . CATARACT EXTRACTION W/  INTRAOCULAR LENS IMPLANT Left 08/05/2020   Procedure: PHACOEMULSIFICATION PC / IOL GMO;  Surgeon: Donnice Sickles, MD;  Location: [facility] OUTPATIENT OR;  Service: Ophthalmology;  Laterality: Left;  . GLAUCOMA SURGERY Left 04/13/2022   Procedure: GLAUCOMA BLEB REVISION; Bleb revision via needling with sub conj mitomycin  OS on 04/13/22  . GLAUCOMA SURGERY Left 04/13/2022   Procedure: BLEB REVISION via needling with mitomycin ;  Surgeon: Geneva Reyes Mcardle, MD;  Location: [facility] OUTPATIENT OR;  Service: Ophthalmology;  Laterality: Left;  mitomycin   . TRABECULECTOMY Left 08/05/2020   Procedure: TRABECULECTOMY  . TRABECULECTOMY Left 08/05/2020   Procedure: TRABECULECTOMY;  Surgeon: Reyes Mcardle Geneva, MD;  Location: [facility] OUTPATIENT OR;  Service: Ophthalmology;  Laterality: Left;  outpatient, combined with phaco by dr sickles, mitomycin     FAMILY HISTORY Family History  Problem Relation Name Age of Onset  . Glaucoma Sister    . Diabetes Sister    . Cataracts Sister    . Hypertension Sister    . Stroke Sister    . Amblyopia Neg Hx    . Blindness Neg Hx    . Cancer Neg Hx    . Macular degeneration Neg Hx    . Retinal detachment Neg Hx    . Strabismus Neg Hx    . Thyroid  disease Neg Hx      SOCIAL HISTORY Social History   Tobacco Use  . Smoking status: Never  . Smokeless tobacco: Never   Substance Use Topics  . Alcohol use: Never        OPHTHALMIC EXAM:  Base Eye Exam     Visual Acuity (Snellen - Linear)       Right Left   Dist [place] NLP 20/200 +1   Dist ph [place]  NI         Tonometry (Tonopen: Tetracaine OU, 11:48 AM)       Right Left   Pressure 30 07         Pupils       Shape   Right Round   Left Round         Neuro/Psych     Oriented x3: Yes   Mood/Affect: Normal         Dilation     Both eyes: 1.0% Tropicamide, 2.5% Phenylephrine @ 11:48 AM           Slit Lamp and Fundus Exam     External Exam       Right Left   External Normal Normal         Slit Lamp Exam       Right Left   Lids/Lashes Ptosis Mild ptosis   Conjunctiva/Sclera 1+ vessels  Diffuse low bleb - 3e, 2i 4h, posterior   Cornea severe SPK severe SPK   Anterior Chamber Deep and quiet Deep and quiet   Iris Round and reactive PI   Lens PC IOL PC IOL, open pc   Anterior Vitreous Normal Normal         Fundus Exam       Right Left   Disc Normal pallor   C/D Ratio 0.9 0.9   Macula rpe mottling rpe mottling   Vessels Normal Normal   Periphery scattered DBH superiorly Normal           Refraction     Manifest Refraction       Sphere Cylinder Axis Dist VA   Right Balance  Left -1.00 +1.75 180 20/200              IMAGING AND PROCEDURES:  OCT, Macula - OU - Both Eyes       Optical Coherence Tomography Right Eye  Patient Cooperation: good.  Technically good study.  Findings: no clinically significant macular edema.  Comparison/Progression: OCT findings are stable.   Left Eye  Patient Cooperation: good.  Technically good study.  Findings: no clinically significant macular edema, normal foveal contour.  Comparison/Progression: OCT findings are stable.   Notes Od: distorted fc, erm, no irf/srf. stable Os: nfc, no irf/srf.  stable           ASSESSMENT/PLAN: Joann Anderson is a 89 y.o. female with the following diagnoses:    1. Primary open angle glaucoma (POAG) of both eyes, severe stage      2. Dry eye      3. Controlled type 2 diabetes mellitus without complication, without long-term current use of insulin    (CMD)  OCT, Macula - OU - Both Eyes    4. Pseudophakia of both eyes       #Dry Eye Syndrome, OU - Preservative free artificial tears at least QID but up to Q1-2H - Lubricating ointment BID  #BRVO OD - DBHs noted 10/22/23 along superior arcades in NLP comfort care eye - ddx diabetic retinopathy, hypertensive retinopathy - continue follow up with PCP to manage co-morbidities  #Controlled type 2 diabetes mellitus without complication, without long-term current use of insulin  - Exam and imaging today 10/22/23 remains stable with no retinal edema OS  #Primary open angle glaucoma (POAG) of both eyes, severe stage  Dr Geneva patient - s/p LSL temporal suture 08/14/20 - s/p phaco/trab/mmc OS 08/05/20 with Dr. Caresse and Dr. Geneva - s/p bleb revision via needling/mmc left eye 04/13/22 with Dr. Geneva.  - NLP OD, Goal of treatment = comfort - IOP good OS   PLAN Dorzolamide-Timolol x2 OD  RTC as scheduled with Dr Geneva 12/28/23  #Pseudophakia of both eyes  - Stable  Next visit:  Return if symptoms worsen or fail to improve.  Case discussed with and patient examined by Dr. Napoleon   I have reviewed the portions of the history and exam completed by the technician, including review of systems, and agree. I explained the diagnoses, plan, and follow up with the patient and they expressed understanding.  Patient expressed understanding of the importance of proper follow up care.   Bernarda Door, MD Ophthalmology, PGY2  Ophthalmic Meds/Labs Ordered this visit:  Orders Placed This Encounter  Procedures  . OCT, Macula - OU - Both Eyes          Patient Instructions  Preservative free artificial tears (Systane or Refresh brands) at least 4-6x/day but up to every hour if needed  Lubricating  ointment twice a day (including once at night)     Abbreviations: M myopia (nearsighted); A astigmatism; H hyperopia (farsighted); P presbyopia; Mrx spectacle prescription;  CTL contact lenses; OD right eye; OS left eye; OU both eyes  XT exotropia; ET esotropia; PEK punctate epithelial keratitis; PEE punctate epithelial erosions; DES dry eye syndrome; MGD meibomian gland dysfunction; ATs artificial tears; PFAT's preservative free artificial tears; NSC nuclear sclerotic cataract; PSC posterior subcapsular cataract; ERM epi-retinal membrane; PVD posterior vitreous detachment; RD retinal detachment; DM diabetes mellitus; DR diabetic retinopathy; NPDR non-proliferative diabetic retinopathy; PDR proliferative diabetic retinopathy; CSME clinically significant macular edema; DME diabetic macular edema; dbh dot blot hemorrhages; CWS cotton wool  spot; POAG primary open angle glaucoma; C/D cup-to-disc ratio; HVF humphrey visual field; GVF goldmann visual field; OCT optical coherence tomography; IOP intraocular pressure; BRVO Branch retinal vein occlusion; CRVO central retinal vein occlusion; CRAO central retinal artery occlusion; BRAO branch retinal artery occlusion; RT retinal tear; SB scleral buckle; PPV pars plana vitrectomy; VH Vitreous hemorrhage; PRP panretinal laser photocoagulation; IVK intravitreal kenalog; VMT vitreomacular traction; MH Macular hole;  NVD neovascularization of the disc; NVE neovascularization elsewhere; AREDS age related eye disease study; ARMD age related macular degeneration; POAG primary open angle glaucoma; EBMD epithelial/anterior basement membrane dystrophy; ACIOL anterior chamber intraocular lens; IOL intraocular lens; PCIOL posterior chamber intraocular lens; Phaco/IOL phacoemulsification with intraocular lens placement; PRK photorefractive keratectomy; LASIK laser assisted in situ keratomileusis; HTN hypertension; DM diabetes mellitus; COPD chronic obstructive pulmonary disease

## 2023-11-25 DIAGNOSIS — I10 Essential (primary) hypertension: Secondary | ICD-10-CM | POA: Diagnosis not present

## 2023-11-25 DIAGNOSIS — M8589 Other specified disorders of bone density and structure, multiple sites: Secondary | ICD-10-CM | POA: Diagnosis not present

## 2023-11-25 DIAGNOSIS — E1122 Type 2 diabetes mellitus with diabetic chronic kidney disease: Secondary | ICD-10-CM | POA: Diagnosis not present

## 2023-11-25 DIAGNOSIS — E11319 Type 2 diabetes mellitus with unspecified diabetic retinopathy without macular edema: Secondary | ICD-10-CM | POA: Diagnosis not present

## 2023-11-25 DIAGNOSIS — Z0000 Encounter for general adult medical examination without abnormal findings: Secondary | ICD-10-CM | POA: Diagnosis not present

## 2023-11-25 DIAGNOSIS — Z23 Encounter for immunization: Secondary | ICD-10-CM | POA: Diagnosis not present

## 2023-11-25 DIAGNOSIS — E1142 Type 2 diabetes mellitus with diabetic polyneuropathy: Secondary | ICD-10-CM | POA: Diagnosis not present

## 2023-11-25 DIAGNOSIS — E7800 Pure hypercholesterolemia, unspecified: Secondary | ICD-10-CM | POA: Diagnosis not present

## 2023-11-25 DIAGNOSIS — N1831 Chronic kidney disease, stage 3a: Secondary | ICD-10-CM | POA: Diagnosis not present

## 2023-11-25 DIAGNOSIS — Z79899 Other long term (current) drug therapy: Secondary | ICD-10-CM | POA: Diagnosis not present

## 2023-12-21 NOTE — Progress Notes (Signed)
Refilled cosopt

## 2023-12-26 NOTE — Progress Notes (Signed)
 Ophthalmology Department Clinical Visit Note     CHIEF COMPLAINT Patient presents for Follow Up Exam   HISTORY OF PRESENT ILLNESS: Joann Anderson is a 89 y.o. old female who presents to the clinic today for:   HPI     Follow Up Exam   Follow-Up For:: Primary open angle glaucoma (POAG) of both eyes, severe stage .  In both eyes.  The patient reports the following associated symptoms blurred vision.        Comments   Blurry vision, feels like her vision fogs at night. Pain every once in a while.  Using Dorzolamide-Timolol x2 right eye and AT.       Last edited by Chiquita PARAS Soots, OA on 12/28/2023  9:00 AM.     Bleb revision via needling/mmc OS 04/13/2022 - bb (for IOP 16) Phaco/trab/mmc OS 08/05/2020 - mg, bb (for IOP 15) LSL temporal suture 08/14/2020 (for IOP 17) Referred by Dr. Cleatus   POAG OU, severe OU   Thin corneas (507 OD, 495 OS) Untreated IOP from 2007 - OD 21, OS 29   PC IOL OD (2007) - Hecker   Ptosis OU  Humphrey visual field interpretation  10-2 OS   10/01/2021: Good reliability OS OS: 10-2:  Extinguished within central 10 degrees - MD -31.6  (no more visual fields)  Humphrey visual field interpretation  10-2 OU  06/30/2017: Good reliability OU OD: Infero-nasal island remaining within central 10 degrees -  MD -27.8; maybe worse OS: Central and nasal islands remaining within central 10 degrees -  MD -22.0; maybe worse  Humphrey visual field interpretation  10-2 OU  08/18/2016: Good reliability OU OD: Inferior nasal island remaining within central 10 degrees - MD -22.4; stable OS: Central and inferior nasal defect within central 10 degrees- MD -20.3; probably stable  Humphrey visual field interpretation 10-2  07/25/2015: Good reliability OU OD:  Loss in both hemifields of central 10 degrees - MD-23.7; stable compared to 2014 OS:  Loss in both hemifields of central 10 degrees - MD-17.7; stable compared to 2015  Humphrey visual field interpretation 10-2 OU  02/08/2014: Good reliability OU OD: Arcuate defect in both hemifields of central 10 OS: Arcuate defect in both hemifields of central 10  Humphrey visual field interpretation 01/18/2013:   Good reliability OU   OD: Central island remaining on 10-2   OS: Superior arcuate defect, Inferior arcuate defect - MD-10   VFs from outside records:   [facility] 05/2012 (with Dr. Cleatus): OD VFI 42, MD-17.6, loss in both hemifields   OS VFI 71, MD -11.6, loss in both hemifields   [facility] 04/2006: OD loss both hemifelds, MD - 14    CURRENT MEDICATIONS: Current Outpatient Medications on File Prior to Visit (Ophthalmic Drugs)  Medication Sig  . artificial tears, hypromellose, (GENTEAL TEARS) 0.3 % gel Apply 1 drop to left eye 2 (two) times a day.  . bimatoprost (LUMIGAN) 0.03 % drop ophthalmic solution Administer 1 drop into the right eye nightly.  . dorzolamide (TRUSOPT) 2 % ophthalmic solution ADMINISTER 1 DROP INTO THE RIGHT EYE 2 TIMES A DAY.  SABRA dorzolamide-timoloL (COSOPT) 22.3-6.8 mg/mL ophthalmic solution Administer 1 drop into the right eye 2 (two) times a day.  . prednisoLONE acetate (PRED FORTE) 1 % ophthalmic suspension Administer 1 drop into left eye 3 (three) times a day.  . timolol (TIMOPTIC) 0.5 % ophthalmic solution Administer 1 drop into the right eye 2 (two) times a day.   Current Outpatient Medications on File  Prior to Visit (Other)  Medication Sig  . amLODIPine (NORVASC) 10 mg tablet Take 10 mg by mouth nightly.  SABRA ascorbic acid (VITAMIN C) 1,000 mg tablet Take 1,000 mg by mouth Once Daily.  SABRA atorvastatin (LIPITOR) 20 mg tablet Take 20 mg by mouth Once Daily.  . cholecalciferol (VITAMIN D3) 10 mcg (400 unit) tablet Take 1 capsule by mouth Once Daily.  . HumuLIN 70/30 U-100 Insulin 100 unit/mL (70-30) injection Inject 20 Units under the skin every morning.  . multivit-min-iron-FA-vit K-lut (Centrum Silver Women) 8 mg iron-400 mcg-50 mcg tab Take 1 tablet by mouth Once Daily.  . pregabalin  (Lyrica) 25 mg capsule Take 25 mg by mouth as needed.  . valsartan-hydroCHLOROthiazide (DIOVAN HCT) 160-12.5 mg per tablet Take 1 tablet by mouth every morning.    Referring physician: No referring provider defined for this encounter.  ALLERGIES Allergies  Allergen Reactions  . Aspirin Other (See Comments)    Causes bleeding  . Quine Rash  . Sulfamethoxazole Rash    PAST MEDICAL HISTORY Past Medical History:  Diagnosis Date  . Breast cancer, left breast    (CMD) 1985   s/p left lump & chemo  . Cataract, nuclear sclerotic senile, left 06/04/2020  . Dementia (CMD)    I reviewed 06/19/2020 PCP notes that included Namenda & Aricept as active meds, however as of 07/30/2020 patient denies taking either med  . Essential hypertension 07/30/2020  . 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 Peripheral edema   . Polyneuropathy due to type 2 diabetes mellitus    (CMD)   . Primary open angle glaucoma of both eyes, severe stage 05/15/2012   Dr. Thresa Bracket  . Pseudophakia of right eye 2007   Dr. Cleatus  . Ptosis of eyelid 07/02/2013  . Recurrent major depression in remission   . Stage 3a chronic kidney disease (CMD)    GFR 57 Scr 1.01 K+ 4.5 on 07/01/2020  . Type 2 diabetes mellitus    (CMD)    A1C 7.2 on 07/01/2020   Past Surgical History:  Procedure Laterality Date  . ABDOMINAL HYSTERECTOMY     Procedure: ABDOMINAL HYSTERECTOMY  . BREAST LUMPECTOMY Left 1985   Procedure: BREAST LUMPECTOMY  . CATARACT EXTRACTION W/  INTRAOCULAR LENS IMPLANT Right 2007   Procedure: CATARACT EXTRACTION W/  INTRAOCULAR LENS IMPLANT; Dr. Cleatus in [place]  . CATARACT EXTRACTION W/  INTRAOCULAR LENS IMPLANT Left 08/05/2020   Procedure: PHACOEMULSIFICATION PC / IOL GMO;  Surgeon: Donnice Sickles, MD;  Location: [facility] OUTPATIENT OR;  Service: Ophthalmology;  Laterality: Left;  . GLAUCOMA SURGERY Left 04/13/2022   Procedure: GLAUCOMA BLEB REVISION; Bleb revision via needling with sub conj mitomycin   OS on 04/13/22  . GLAUCOMA SURGERY Left 04/13/2022   Procedure: BLEB REVISION via needling with mitomycin ;  Surgeon: Bracket Reyes Thresa, MD;  Location: [facility] OUTPATIENT OR;  Service: Ophthalmology;  Laterality: Left;  mitomycin   . TRABECULECTOMY Left 08/05/2020   Procedure: TRABECULECTOMY  . TRABECULECTOMY Left 08/05/2020   Procedure: TRABECULECTOMY;  Surgeon: Reyes Thresa Bracket, MD;  Location: [facility] OUTPATIENT OR;  Service: Ophthalmology;  Laterality: Left;  outpatient, combined with phaco by dr sickles, mitomycin     FAMILY HISTORY Family History  Problem Relation Name Age of Onset  . Glaucoma Sister    . Diabetes Sister    . Cataracts Sister    . Hypertension Sister    . Stroke Sister    . Amblyopia Neg Hx    . Blindness Neg  Hx    . Cancer Neg Hx    . Macular degeneration Neg Hx    . Retinal detachment Neg Hx    . Strabismus Neg Hx    . Thyroid  disease Neg Hx      SOCIAL HISTORY Social History   Tobacco Use  . Smoking status: Never  . Smokeless tobacco: Never  Substance Use Topics  . Alcohol use: Never        OPHTHALMIC EXAM:  Base Eye Exam     Visual Acuity (Snellen - Linear)       Right Left   Dist [place] NLP CF at 1         Tonometry (Applanation, 9:26 AM)       Right Left   Pressure 33 6         Tonometry #2 (Applanation, post-DOC, 9:27 AM)       Right Left   Pressure  4         Neuro/Psych     Oriented x3: Yes   Mood/Affect: Normal           Slit Lamp and Fundus Exam     Slit Lamp Exam       Right Left   Lids/Lashes Ptosis Mild ptosis   Conjunctiva/Sclera 1+ vessels  Diffuse low bleb - 3e, 2i 4h, posterior   Cornea Clear  Clear   Anterior Chamber Deep and quiet Deep and quiet   Iris Round and reactive PI   Lens PC IOL PC IOL         Fundus Exam       Right Left   Disc  3+ pallor, loss of rim 360   C/D Ratio  0.9   Macula  ok            IMAGING AND PROCEDURES:  @EYRES @     ASSESSMENT: 1. Primary open angle  glaucoma (POAG) of both eyes, severe stage       Artificial tears added at comprehensive clinic on 10/21/2023. NLP OD, Goal of treatment = comfort IOP good OS, IOP = 4 OS after DOC. Continue drops the same Recheck in 4-5 months  PLAN: Dorzolamide-Timolol x2 OD  Recommends AT's (Systane) Reviewed proper drop instillation technique Return in about 4 months (around 04/28/2024).  Ophthalmic Meds Ordered this visit:  There were no meds ordered this visit.      Patient Instructions  DAILY DRUG REMINDER  Wait 10 minutes between drops!!! (Keep eyes closed for 2 MINUTES after each drop)   Drug EYE R = Right L = Left B = Both Breakfast Lunch Supper Bedtime Time Frame  Dorzolamide-Timolol *Cosopt* (Dark blue top)  R X  X  Take about 12 hours apart.  Artificial Tears (Systane)  B     As needed, for comfort          HOW TO TAKE EYEDROPS 1.   Make sure you understand when, and exactly how often, to take your eyedrop medicine.  Preferably, learn the names of your medicines, or at least be able to identify your eyedrops by the color of the cap of the bottle (e.g., "I take the purple top 2 times a day in the right eye and the yellow top 1 time a day in both eyes."). 2.  Always wash your hands prior to instilling your eyedrops. 3.  If you are putting drops in both eyes, then sit down or lie down to put in your drops.  This is because it  is important to close the eye getting drops for 2 minutes immediately after instilling the drop (this markedly improves absorption).  If you are putting the drops in both eyes, then you will be closing both eyes for at least 2 minutes, therefore you should be sitting or lying down.  If you must stand to put in the drops (need to look in mirror), then have a chair next to you so that you can sit just after instilling the drops and close your eyes for 2 minutes.  If you are only instilling an eyedrop in one eye, then you only have to close that eye after the  drop, and therefore you don't necessarily have to sit or lie. 4.  If you are taking more than one drop at a time in the same eye, then you must separate the drops by at least 10 minutes. 5.  To put in the drop, hold the bottle in your dominant hand between your forefinger and thumb and invert the bottle over the targeted eye.  Your head should be tilted back and you should roll your eyes upward. 6.  With your other hand, pull the lower eyelid down and try to instill the drop in the "pouch" made by pulling the lid down, or on the lower part of the eye ball.  Position the bottle close to the eye, but do not touch the eye with the dropper tip.  Squeeze the sides of the bottle and a drop of the medicine will come out. 7.  You will feel the drop when it contacts the eye, and then you should immediately close the eye, or if you are putting the drop in both eyes, move your hands immediately to the other eye, and place the drop in it, and then close both eyes as recommended in #2 above. 8.  While the eye/s are closed, use a tissue to wipe away any medication that remains on the lid or lashes.  You may do this during the 2 minutes that the eye remains closed, just do not open the eye to wipe it. 9.  Always remember to put the cap back on the bottle and store the bottle away from pets and not in direct sunlight.  The bottle does not have to be refrigerated, but it may be if that is preferred (some people prefer feeling the cooled eyedrop on the eye). 10.  Never stop the drops without conferring with your doctor first, even if you feel that the drops are not helping your eye.  If you believe you are experiencing side-effects from the eyedrops, then discuss that, or any issues or concerns,  with your doctor.   This document serves as a record of services personally performed by J. Thresa Bracket. It was created on their behalf by My Rayleen, a trained medical scribe. The creation of this record is the provider's dictation  and/or activities during the visit. I agree the documentation is accurate and complete.  Electronically signed by: JINNY Thresa Bracket, MD 01/01/2024 8:23 PM

## 2023-12-28 DIAGNOSIS — H401133 Primary open-angle glaucoma, bilateral, severe stage: Secondary | ICD-10-CM | POA: Diagnosis not present

## 2024-02-21 DIAGNOSIS — H401133 Primary open-angle glaucoma, bilateral, severe stage: Secondary | ICD-10-CM | POA: Diagnosis not present

## 2024-02-21 DIAGNOSIS — E119 Type 2 diabetes mellitus without complications: Secondary | ICD-10-CM | POA: Diagnosis not present

## 2024-02-21 DIAGNOSIS — H3581 Retinal edema: Secondary | ICD-10-CM | POA: Diagnosis not present

## 2024-02-21 DIAGNOSIS — Z961 Presence of intraocular lens: Secondary | ICD-10-CM | POA: Diagnosis not present

## 2024-02-21 NOTE — Progress Notes (Signed)
 CHIEF COMPLAINT Chief Complaint  Patient presents with  . Retina Evaluation      HISTORY OF PRESENT ILLNESS: Joann Anderson is a 89 y.o. y.o. old female who presents to the clinic today for Urgent visit. Patient has hx of controlled type 2 diabetes mellitus without complication, without long-term current use of insulin, primary open-angle glaucoma of both eyes, and pseudophakia of both eyes. She is s/p LSL temporal suture 08/14/20, s/p phaco/trab/mmc OS 08/05/20 with Dr. Caresse and Dr. Geneva, and s/p bleb revision via needling/mmc left eye 04/13/22 with Dr. Geneva.   Today the patient reports stable vision.   She denies other concerns at this time.  Denies any fevers, weight loss, night sweats.   Review of Systems:   Constitutional symptoms: negative Eyes:  negative Ear, nose, throat:  negative Cardiovascular:  negative Respiratory:  negative Gastrointestinal:  negative Genitourinary:  negative Skin:  negative Neurological:  negative Musculoskeletal:  negative Psychiatric:  negative Endocrine:  negative Hematological:  negative Allergic:  negative   Selected notes from the MEDICAL RECORD NUMBER Per Dr. Geneva on 10/11/22  ASSESSMENT 1. Primary open angle glaucoma (POAG) of both eyes, severe stage          ~7 months after bleb needling OS 04/13/22 NLP OD, Goal of treatment = comfort Looks great - IOP good left eye   Continue drop the same right eye.  Recheck in 4 months.     PLAN Dorzolamide-Timolol x2 OD  Recommends ATs Return in about 4 months (around 02/10/2023).    CURRENT DROPS: See med list  Referring physician: No referring provider defined for this encounter.  REVIEW OF SYSTEMS: See tech note  MEDICATIONS Current Outpatient Medications  Medication Sig Dispense Refill  . artificial tears, hypromellose, (GENTEAL TEARS) 0.3 % gel Apply 1 drop to left eye 2 (two) times a day.    SABRA amLODIPine (NORVASC) 10 mg tablet Take 10 mg by mouth nightly.    SABRA  ascorbic acid (VITAMIN C) 1,000 mg tablet Take 1,000 mg by mouth Once Daily.    SABRA atorvastatin (LIPITOR) 20 mg tablet Take 20 mg by mouth Once Daily.    . bimatoprost (LUMIGAN) 0.03 % drop ophthalmic solution Administer 1 drop into the right eye nightly. (Patient not taking: Reported on 02/21/2024)    . cholecalciferol (VITAMIN D3) 10 mcg (400 unit) tablet Take 1 capsule by mouth Once Daily.    . dorzolamide (TRUSOPT) 2 % ophthalmic solution ADMINISTER 1 DROP INTO THE RIGHT EYE 2 TIMES A DAY. (Patient not taking: Reported on 02/21/2024) 10 mL 11  . dorzolamide-timoloL (COSOPT) 22.3-6.8 mg/mL ophthalmic solution Administer 1 drop into the right eye 2 (two) times a day. 30 mL 11  . HumuLIN 70/30 U-100 Insulin 100 unit/mL (70-30) injection Inject 20 Units under the skin every morning.    . moxifloxacin (VIGAMOX) 0.5 % ophthalmic solution Administer 1 drop into left eye 4 (four) times a day for 7 days. 3 mL 1  . multivit-min-iron-FA-vit K-lut (Centrum Silver Women) 8 mg iron-400 mcg-50 mcg tab Take 1 tablet by mouth Once Daily.    . prednisoLONE acetate (PRED FORTE) 1 % ophthalmic suspension Administer 1 drop into left eye 3 (three) times a day. (Patient not taking: Reported on 02/21/2024) 10 mL 6  . pregabalin (Lyrica) 25 mg capsule Take 25 mg by mouth as needed.    . timolol (TIMOPTIC) 0.5 % ophthalmic solution Administer 1 drop into the right eye 2 (two) times a day. (Patient not taking:  Reported on 02/21/2024) 10 mL 11  . valsartan-hydroCHLOROthiazide (DIOVAN HCT) 160-12.5 mg per tablet Take 1 tablet by mouth every morning.     No current facility-administered medications for this visit.     ALLERGIES Allergies  Allergen Reactions  . Aspirin Other (See Comments)    Causes bleeding  . Quine Rash  . Sulfamethoxazole Rash    PAST MEDICAL HISTORY Past Surgical History:  Procedure Laterality Date  . ABDOMINAL HYSTERECTOMY     Procedure: ABDOMINAL HYSTERECTOMY  . BREAST LUMPECTOMY Left 1985    Procedure: BREAST LUMPECTOMY  . CATARACT EXTRACTION W/  INTRAOCULAR LENS IMPLANT Right 2007   Procedure: CATARACT EXTRACTION W/  INTRAOCULAR LENS IMPLANT; Dr. Cleatus in [place]  . CATARACT EXTRACTION W/  INTRAOCULAR LENS IMPLANT Left 08/05/2020   Procedure: PHACOEMULSIFICATION PC / IOL GMO;  Surgeon: Donnice Sickles, MD;  Location: [facility] OUTPATIENT OR;  Service: Ophthalmology;  Laterality: Left;  . GLAUCOMA SURGERY Left 04/13/2022   Procedure: GLAUCOMA BLEB REVISION; Bleb revision via needling with sub conj mitomycin  OS on 04/13/22  . GLAUCOMA SURGERY Left 04/13/2022   Procedure: BLEB REVISION via needling with mitomycin ;  Surgeon: Geneva Reyes Mcardle, MD;  Location: [facility] OUTPATIENT OR;  Service: Ophthalmology;  Laterality: Left;  mitomycin   . TRABECULECTOMY Left 08/05/2020   Procedure: TRABECULECTOMY  . TRABECULECTOMY Left 08/05/2020   Procedure: TRABECULECTOMY;  Surgeon: Reyes Mcardle Geneva, MD;  Location: [facility] OUTPATIENT OR;  Service: Ophthalmology;  Laterality: Left;  outpatient, combined with phaco by dr sickles, mitomycin     FAMILY HISTORY Family History  Problem Relation Name Age of Onset  . Glaucoma Sister    . Diabetes Sister    . Cataracts Sister    . Hypertension Sister    . Stroke Sister    . Amblyopia Neg Hx    . Blindness Neg Hx    . Cancer Neg Hx    . Macular degeneration Neg Hx    . Retinal detachment Neg Hx    . Strabismus Neg Hx    . Thyroid  disease Neg Hx       SOCIAL HISTORY Social History   Socioeconomic History  . Marital status: Married    Spouse name: Not on file  . Number of children: Not on file  . Years of education: Not on file  . Highest education level: Not on file  Occupational History  . Not on file  Tobacco Use  . Smoking status: Never  . Smokeless tobacco: Never  Substance and Sexual Activity  . Alcohol use: Never  . Drug use: Not on file  . Sexual activity: Not on file  Other Topics Concern  . 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   [employer] Insecurity: Not on file  Transportation Needs: Not on file  Safety: Not on file  Living Situation: Not on file       OPHTHALMIC EXAM: Base Eye Exam     Visual Acuity (Snellen - Linear)       Right Left   Dist [place] NLP HM         Tonometry (Tonopen: Tetracaine OU, 9:28 AM)       Right Left   Pressure 24 8         Tonometry #2 (Tonopen: Tetracaine OU, 9:29 AM)       Right Left   Pressure 22  Pupils       Shape React APD   Right Irregular Minimal +   Left Round Slow None         Visual Fields (Counting fingers)       Left Right   Restrictions Partial outer superior temporal, superior nasal deficiencies Total superior temporal, inferior temporal, superior nasal, inferior nasal deficiencies         Neuro/Psych     Oriented x3: Yes   Mood/Affect: Normal         Dilation     Both eyes: 1.0% Tropicamide, 2.5% Phenylephrine @ 9:29 AM           Slit Lamp and Fundus Exam     External Exam       Right Left   External Normal Normal         Slit Lamp Exam       Right Left   Lids/Lashes Ptosis mild ptosis   Conjunctiva/Sclera 1+ vessels  Trab at 12, cystic bleb, intact, no leakage, loose suture   Cornea Clear  Clear   Anterior Chamber Deep and quiet, no cell/flare Deep and quiet   Iris Round and reactive PI   Lens PCIOL, silicone lens, 2+ PCO PCIOL, open PC   Anterior Vitreous Normal Normal         Fundus Exam       Right Left   Disc Normal Normal   C/D Ratio 0.9 0.3   Macula Normal Normal, no retinal edema   Vessels Normal Normal   Periphery Normal, attached Normal               IMAGING   Imaging  Testing report: Optical coherence tomography Date obtained - 02/21/2024 Indication for imaging: retinal edema  Technically adequate study. Patient compliance high.   Right Eye:  Central foveal thickness:  261 Findings: mild ERM, no retinal edema Comparison to previous: stable   Left Eye:  Central foveal thickness: 241 Findings: NFD Comparison to previous: stable   Diagnosis / Impression: ERM OD  Clinical management:  See below   Abbreviations: NFP--Normal foveal profile. Normal OCT. CME--cystoid macular edema. PED--pigment epithelial detachment. SRF--Subretinal fluid.  EZ --ellipsoid zone. ERM Epiretinal membrane.. ORT - outer retinal tubulation. SRHM - subretinal hyper-reflective material    ASSESSMENT/PLAN:   1. Primary open angle glaucoma (POAG) of both eyes, severe stage  OCT, Macula - OU - Both Eyes   OCT, Macula - OU - Both Eyes    2. Retinal edema  OCT, Macula - OU - Both Eyes    3. Pseudophakia of both eyes      4. Controlled type 2 diabetes mellitus without complication, without long-term current use of insulin    (CMD)         1. Primary open-angle glaucoma, bilateral, severe stage - Under the excellent care of Dr. Geneva - s/p LSL temporal suture 08/14/20 - s/p phaco/trab/mmc OS 08/05/20 with Dr. Caresse and Dr. Geneva - s/p bleb revision via needling/mmc left eye 04/13/22 with Dr. Geneva.   2. Retinal edema - No retinal edema noted on imaging or exam - Monitor   3. Pseudophakia of both eyes  - Stable - Exam today 12/14/22 shows loose suture OS - removed at slit lamp - Start Vigamox QID OS for 7 days and then d/c.  - Recommend frequent ATs 6x day   4. Controlled type 2 diabetes mellitus without complication, without long-term current use of insulin  - I have stressed strict  glycemic control with a goal A1c< 7.0 along with continued optimization of serum lipids, blood pressure , and avoiding cigarette or any type of tobacco, and the need for long term follow up was also discussed with patient. - Exam and imaging today 02/21/2024 shows mild ERM OD , NFD OS  - Patient is quiet with no signs of active inflammation on ocular exam today 02/21/24 - No treatment required  at this time - I will monitor - Continue good glycemic control  - Follow up in 1 year for reevaluation    =====  Paticia Fairly, MD  Diseases and Surgery of the Retina and Vitreous and  Uveitis and Ocular Immunology Assistant Professor of Ophthalmology Department of Ophthalmology  [facility]   Requested Prescriptions   Signed Prescriptions Disp Refills  . dorzolamide-timoloL (COSOPT) 22.3-6.8 mg/mL ophthalmic solution 30 mL 11    Sig: Administer 1 drop into the right eye 2 (two) times a day.        Return in about 1 year (around 02/20/2025) for Reevaluation.     Abbreviations DME diabetic macular edema; PDR proliferative diabetic retinopathy; NPDR non proliferative diabetic retinopathy;  BRVO  Branch retinal vein occlusion; CRVO central retinal vein occlusion; ; RD retinal detachment; RT retinal tear; SB scleral buckle; PPV pars plana vitrectomy; VH  Vitreous hemorrhage; PRP  panretinal laser photocoagulation; IVK intravitreal kenalog; IVA intravitreal avastin; IVL intravitreal Lucentis; IVE Intravitreal Eylea; PVD posterior vitreous detachment; OD right eye; OS left eye; OU  Both eyes;  VMT  vitreomacular traction; MH  Macular hole; ERM epiretinal membrane; NVD neovascularization of the disc; NVE neovascularization elsewhere; AREDS age related eye disease study  This document serves as a record of services personally performed by Paticia Fairly, MD. It was created on their behalf by Charmaine Pinal a trained medical scribe. The creation of this record is the provider's dictation and/or activities during the visit.   I agree the documentation is accurate and complete.  Electronically signed by: Paticia FORBES Fairly MD 02/21/2024 6:13 AM

## 2024-03-02 DIAGNOSIS — H401133 Primary open-angle glaucoma, bilateral, severe stage: Secondary | ICD-10-CM | POA: Diagnosis not present

## 2024-04-26 ENCOUNTER — Encounter (HOSPITAL_COMMUNITY)
Admission: EM | Disposition: A | Discharge status: Home / Self Care | Payer: Self-pay | Source: Home / Self Care | Attending: Neurology

## 2024-04-26 ENCOUNTER — Other Ambulatory Visit: Payer: Self-pay

## 2024-04-26 ENCOUNTER — Emergency Department (HOSPITAL_COMMUNITY)

## 2024-04-26 ENCOUNTER — Inpatient Hospital Stay (HOSPITAL_COMMUNITY): Admitting: Certified Registered Nurse Anesthetist

## 2024-04-26 ENCOUNTER — Encounter (HOSPITAL_COMMUNITY): Payer: Self-pay

## 2024-04-26 ENCOUNTER — Inpatient Hospital Stay (HOSPITAL_COMMUNITY)
Admission: EM | Admit: 2024-04-26 | Discharge: 2024-04-28 | DRG: 024 | Disposition: A | Discharge status: Home / Self Care | Attending: Neurology | Admitting: Neurology

## 2024-04-26 ENCOUNTER — Inpatient Hospital Stay (HOSPITAL_COMMUNITY)

## 2024-04-26 DIAGNOSIS — E7800 Pure hypercholesterolemia, unspecified: Secondary | ICD-10-CM | POA: Diagnosis present

## 2024-04-26 DIAGNOSIS — H5461 Unqualified visual loss, right eye, normal vision left eye: Secondary | ICD-10-CM | POA: Diagnosis present

## 2024-04-26 DIAGNOSIS — G9389 Other specified disorders of brain: Secondary | ICD-10-CM | POA: Diagnosis not present

## 2024-04-26 DIAGNOSIS — G8194 Hemiplegia, unspecified affecting left nondominant side: Secondary | ICD-10-CM | POA: Diagnosis present

## 2024-04-26 DIAGNOSIS — R414 Neurologic neglect syndrome: Secondary | ICD-10-CM | POA: Diagnosis present

## 2024-04-26 DIAGNOSIS — Z90710 Acquired absence of both cervix and uterus: Secondary | ICD-10-CM

## 2024-04-26 DIAGNOSIS — E1165 Type 2 diabetes mellitus with hyperglycemia: Secondary | ICD-10-CM | POA: Diagnosis present

## 2024-04-26 DIAGNOSIS — E785 Hyperlipidemia, unspecified: Secondary | ICD-10-CM | POA: Diagnosis not present

## 2024-04-26 DIAGNOSIS — I63131 Cerebral infarction due to embolism of right carotid artery: Secondary | ICD-10-CM

## 2024-04-26 DIAGNOSIS — I129 Hypertensive chronic kidney disease with stage 1 through stage 4 chronic kidney disease, or unspecified chronic kidney disease: Secondary | ICD-10-CM | POA: Diagnosis present

## 2024-04-26 DIAGNOSIS — R509 Fever, unspecified: Secondary | ICD-10-CM | POA: Diagnosis not present

## 2024-04-26 DIAGNOSIS — R4189 Other symptoms and signs involving cognitive functions and awareness: Secondary | ICD-10-CM | POA: Diagnosis present

## 2024-04-26 DIAGNOSIS — R531 Weakness: Secondary | ICD-10-CM | POA: Diagnosis not present

## 2024-04-26 DIAGNOSIS — E1122 Type 2 diabetes mellitus with diabetic chronic kidney disease: Secondary | ICD-10-CM | POA: Diagnosis present

## 2024-04-26 DIAGNOSIS — Z79899 Other long term (current) drug therapy: Secondary | ICD-10-CM | POA: Diagnosis not present

## 2024-04-26 DIAGNOSIS — Z886 Allergy status to analgesic agent status: Secondary | ICD-10-CM

## 2024-04-26 DIAGNOSIS — Z794 Long term (current) use of insulin: Secondary | ICD-10-CM

## 2024-04-26 DIAGNOSIS — I639 Cerebral infarction, unspecified: Secondary | ICD-10-CM | POA: Diagnosis not present

## 2024-04-26 DIAGNOSIS — Z7982 Long term (current) use of aspirin: Secondary | ICD-10-CM | POA: Diagnosis not present

## 2024-04-26 DIAGNOSIS — R29700 NIHSS score 0: Secondary | ICD-10-CM | POA: Diagnosis not present

## 2024-04-26 DIAGNOSIS — E1151 Type 2 diabetes mellitus with diabetic peripheral angiopathy without gangrene: Secondary | ICD-10-CM | POA: Diagnosis not present

## 2024-04-26 DIAGNOSIS — Z743 Need for continuous supervision: Secondary | ICD-10-CM | POA: Diagnosis not present

## 2024-04-26 DIAGNOSIS — W19XXXA Unspecified fall, initial encounter: Secondary | ICD-10-CM | POA: Diagnosis not present

## 2024-04-26 DIAGNOSIS — I63411 Cerebral infarction due to embolism of right middle cerebral artery: Secondary | ICD-10-CM | POA: Diagnosis present

## 2024-04-26 DIAGNOSIS — E119 Type 2 diabetes mellitus without complications: Secondary | ICD-10-CM

## 2024-04-26 DIAGNOSIS — R29810 Facial weakness: Secondary | ICD-10-CM | POA: Diagnosis not present

## 2024-04-26 DIAGNOSIS — Z888 Allergy status to other drugs, medicaments and biological substances status: Secondary | ICD-10-CM | POA: Diagnosis not present

## 2024-04-26 DIAGNOSIS — R29709 NIHSS score 9: Secondary | ICD-10-CM | POA: Diagnosis present

## 2024-04-26 DIAGNOSIS — R220 Localized swelling, mass and lump, head: Secondary | ICD-10-CM | POA: Diagnosis not present

## 2024-04-26 DIAGNOSIS — Z882 Allergy status to sulfonamides status: Secondary | ICD-10-CM | POA: Diagnosis not present

## 2024-04-26 DIAGNOSIS — I6601 Occlusion and stenosis of right middle cerebral artery: Secondary | ICD-10-CM | POA: Diagnosis not present

## 2024-04-26 DIAGNOSIS — I6389 Other cerebral infarction: Secondary | ICD-10-CM | POA: Diagnosis not present

## 2024-04-26 DIAGNOSIS — N1831 Chronic kidney disease, stage 3a: Secondary | ICD-10-CM | POA: Diagnosis present

## 2024-04-26 DIAGNOSIS — I63511 Cerebral infarction due to unspecified occlusion or stenosis of right middle cerebral artery: Secondary | ICD-10-CM

## 2024-04-26 DIAGNOSIS — R29818 Other symptoms and signs involving the nervous system: Secondary | ICD-10-CM | POA: Diagnosis not present

## 2024-04-26 HISTORY — PX: IR PERCUTANEOUS ART THROMBECTOMY/INFUSION INTRACRANIAL INC DIAG ANGIO: IMG6087

## 2024-04-26 HISTORY — PX: RADIOLOGY WITH ANESTHESIA: SHX6223

## 2024-04-26 LAB — DIFFERENTIAL
Abs Immature Granulocytes: 0.01 K/uL (ref 0.00–0.07)
Basophils Absolute: 0 K/uL (ref 0.0–0.1)
Basophils Relative: 1 %
Eosinophils Absolute: 0.5 K/uL (ref 0.0–0.5)
Eosinophils Relative: 8 %
Immature Granulocytes: 0 %
Lymphocytes Relative: 25 %
Lymphs Abs: 1.6 K/uL (ref 0.7–4.0)
Monocytes Absolute: 0.9 K/uL (ref 0.1–1.0)
Monocytes Relative: 14 %
Neutro Abs: 3.3 K/uL (ref 1.7–7.7)
Neutrophils Relative %: 52 %

## 2024-04-26 LAB — CBC
HCT: 32.7 % — ABNORMAL LOW (ref 36.0–46.0)
Hemoglobin: 10.5 g/dL — ABNORMAL LOW (ref 12.0–15.0)
MCH: 32.4 pg (ref 26.0–34.0)
MCHC: 32.1 g/dL (ref 30.0–36.0)
MCV: 100.9 fL — ABNORMAL HIGH (ref 80.0–100.0)
Platelets: 198 K/uL (ref 150–400)
RBC: 3.24 MIL/uL — ABNORMAL LOW (ref 3.87–5.11)
RDW: 11.7 % (ref 11.5–15.5)
WBC: 6.3 K/uL (ref 4.0–10.5)
nRBC: 0 % (ref 0.0–0.2)

## 2024-04-26 LAB — I-STAT CHEM 8, ED
BUN: 31 mg/dL — ABNORMAL HIGH (ref 8–23)
Calcium, Ion: 1.19 mmol/L (ref 1.15–1.40)
Chloride: 107 mmol/L (ref 98–111)
Creatinine, Ser: 1.1 mg/dL — ABNORMAL HIGH (ref 0.44–1.00)
Glucose, Bld: 258 mg/dL — ABNORMAL HIGH (ref 70–99)
HCT: 33 % — ABNORMAL LOW (ref 36.0–46.0)
Hemoglobin: 11.2 g/dL — ABNORMAL LOW (ref 12.0–15.0)
Potassium: 4.4 mmol/L (ref 3.5–5.1)
Sodium: 140 mmol/L (ref 135–145)
TCO2: 20 mmol/L — ABNORMAL LOW (ref 22–32)

## 2024-04-26 LAB — COMPREHENSIVE METABOLIC PANEL WITH GFR
ALT: 26 U/L (ref 0–44)
AST: 34 U/L (ref 15–41)
Albumin: 3.5 g/dL (ref 3.5–5.0)
Alkaline Phosphatase: 74 U/L (ref 38–126)
Anion gap: 10 (ref 5–15)
BUN: 29 mg/dL — ABNORMAL HIGH (ref 8–23)
CO2: 20 mmol/L — ABNORMAL LOW (ref 22–32)
Calcium: 9.2 mg/dL (ref 8.9–10.3)
Chloride: 107 mmol/L (ref 98–111)
Creatinine, Ser: 1.14 mg/dL — ABNORMAL HIGH (ref 0.44–1.00)
GFR, Estimated: 45 mL/min — ABNORMAL LOW (ref >60)
Glucose, Bld: 256 mg/dL — ABNORMAL HIGH (ref 70–99)
Potassium: 4.5 mmol/L (ref 3.5–5.1)
Sodium: 137 mmol/L (ref 135–145)
Total Bilirubin: 0.7 mg/dL (ref 0.0–1.2)
Total Protein: 7.1 g/dL (ref 6.5–8.1)

## 2024-04-26 LAB — CBG MONITORING, ED: Glucose-Capillary: 242 mg/dL — ABNORMAL HIGH (ref 70–99)

## 2024-04-26 LAB — ETHANOL: Alcohol, Ethyl (B): <15 mg/dL (ref <15)

## 2024-04-26 LAB — PROTIME-INR
INR: 1.1 (ref 0.8–1.2)
Prothrombin Time: 14.4 s (ref 11.4–15.2)

## 2024-04-26 LAB — APTT: aPTT: 28 s (ref 24–36)

## 2024-04-26 SURGERY — RADIOLOGY WITH ANESTHESIA
Anesthesia: General

## 2024-04-26 MED ORDER — LACTATED RINGERS IV SOLN: INTRAVENOUS | Status: DC | PRN

## 2024-04-26 MED ORDER — TENECTEPLASE FOR STROKE
0.2500 mg/kg | PACK | Freq: Once | INTRAVENOUS | Status: AC
  Administered 2024-04-26: 20 mg via INTRAVENOUS
  Filled 2024-04-26: qty 10

## 2024-04-26 MED ORDER — INSULIN ASPART 100 UNIT/ML IJ SOLN
0.0000 [IU] | INTRAMUSCULAR | Status: DC
  Administered 2024-04-27: 5 [IU] via SUBCUTANEOUS
  Administered 2024-04-27: 3 [IU] via SUBCUTANEOUS

## 2024-04-26 MED ORDER — DEXAMETHASONE SOD PHOSPHATE PF 10 MG/ML IJ SOLN
INTRAMUSCULAR | Status: DC | PRN
  Administered 2024-04-26: 5 mg via INTRAVENOUS

## 2024-04-26 MED ORDER — IOHEXOL 300 MG/ML  SOLN
150.0000 mL | Freq: Once | INTRAMUSCULAR | Status: AC | PRN
  Administered 2024-04-26: 25 mL via INTRA_ARTERIAL

## 2024-04-26 MED ORDER — ONDANSETRON HCL 4 MG/2ML IJ SOLN: 4.0000 mg | Freq: Once | INTRAMUSCULAR | Status: DC | PRN

## 2024-04-26 MED ORDER — STROKE: EARLY STAGES OF RECOVERY BOOK: Freq: Once | Status: AC

## 2024-04-26 MED ORDER — FENTANYL CITRATE (PF) 100 MCG/2ML IJ SOLN: 25.0000 ug | INTRAMUSCULAR | Status: DC | PRN

## 2024-04-26 MED ORDER — SODIUM CHLORIDE 0.9% FLUSH
3.0000 mL | Freq: Once | INTRAVENOUS | Status: AC
  Administered 2024-04-26: 3 mL via INTRAVENOUS

## 2024-04-26 MED ORDER — IOHEXOL 350 MG/ML SOLN
75.0000 mL | Freq: Once | INTRAVENOUS | Status: AC | PRN
Start: 2024-04-26 — End: 2024-04-26
  Administered 2024-04-26: 75 mL via INTRAVENOUS

## 2024-04-26 MED ORDER — OXYCODONE HCL 5 MG/5ML PO SOLN: 5.0000 mg | Freq: Once | ORAL | Status: DC | PRN

## 2024-04-26 MED ORDER — ACETAMINOPHEN 160 MG/5ML PO SOLN: 650.0000 mg | ORAL | Status: DC | PRN

## 2024-04-26 MED ORDER — LIDOCAINE HCL (CARDIAC) PF 100 MG/5ML IV SOSY
PREFILLED_SYRINGE | INTRAVENOUS | Status: DC | PRN
  Administered 2024-04-26: 100 mg via INTRAVENOUS

## 2024-04-26 MED ORDER — ROCURONIUM BROMIDE 100 MG/10ML IV SOLN
INTRAVENOUS | Status: DC | PRN
  Administered 2024-04-26: 60 mg via INTRAVENOUS

## 2024-04-26 MED ORDER — SODIUM CHLORIDE 0.9 % IV SOLN: INTRAVENOUS | Status: DC

## 2024-04-26 MED ORDER — ONDANSETRON HCL 4 MG/2ML IJ SOLN
INTRAMUSCULAR | Status: DC | PRN
  Administered 2024-04-26: 4 mg via INTRAVENOUS

## 2024-04-26 MED ORDER — ACETAMINOPHEN 325 MG PO TABS: 650.0000 mg | ORAL_TABLET | ORAL | Status: DC | PRN

## 2024-04-26 MED ORDER — LABETALOL HCL 5 MG/ML IV SOLN: 10.0000 mg | Freq: Once | INTRAVENOUS | Status: DC | PRN

## 2024-04-26 MED ORDER — PANTOPRAZOLE SODIUM 40 MG IV SOLR: 40.0000 mg | Freq: Every day | INTRAVENOUS | Status: DC

## 2024-04-26 MED ORDER — PROPOFOL 10 MG/ML IV BOLUS
INTRAVENOUS | Status: DC | PRN
  Administered 2024-04-26: 90 mg via INTRAVENOUS

## 2024-04-26 MED ORDER — OXYCODONE HCL 5 MG PO TABS: 5.0000 mg | ORAL_TABLET | Freq: Once | ORAL | Status: DC | PRN

## 2024-04-26 MED ORDER — NICARDIPINE HCL IN NACL 20-0.86 MG/200ML-% IV SOLN: 0.0000 mg/h | INTRAVENOUS | Status: DC | PRN

## 2024-04-26 MED ORDER — SUGAMMADEX SODIUM 200 MG/2ML IV SOLN
INTRAVENOUS | Status: DC | PRN
  Administered 2024-04-26: 200 mg via INTRAVENOUS

## 2024-04-26 MED ORDER — ACETAMINOPHEN 10 MG/ML IV SOLN: 1000.0000 mg | Freq: Once | INTRAVENOUS | Status: DC | PRN

## 2024-04-26 MED ORDER — ACETAMINOPHEN 650 MG RE SUPP: 650.0000 mg | RECTAL | Status: DC | PRN

## 2024-04-26 NOTE — Code Documentation (Signed)
 Responded to Code Stroke called at 2118 for L sided weakness and R gaze, LSN-1800. Pt arrived at 2131, CBG-242, NIH-8, CT head negative for acute changes. TNK given at 2148. CTA-prox R M2 MCA occlusion. IR paged out at 2217 and pt txed to IR, arriving at 2226.

## 2024-04-26 NOTE — H&P (Signed)
 NEUROLOGY H&P NOTE   Date of service: April 26, 2024 Patient Name: Joann Anderson MRN:  [id number] DOB:  [birth year] Chief Complaint: facial weakness  History of Present Illness  Joann Anderson is a 89 y.o. female with hx of diabetes, hypercholesterolemia, CKD 3a, essential hypertension who presents with left-sided numbness, left facial droop and left-sided neglect that started abruptly earlier this evening.  She was last known well at 6 PM when she was up and walking around without difficulty.  Subsequently, her husband went to give her some insulin and noticed that she did not look quite right.  She then tried to stand up and was unable to walk.  She fell but did not hit her head, and did not think that she had any significant injury.  EMS was called, and they noticed that she had fairly significant left-sided neglect and therefore activated code stroke.  She was evaluated emergently at the bridge and taken for CT which demonstrated no acute intracranial findings, and a CTA which demonstrated an M2 occlusion.   Last known well: 6 PM Modified rankin score: 0-Completely asymptomatic and back to baseline post- stroke IV Thrombolysis: Yes Thrombectomy: Yes   NIHSS components Score: Comment  1a Level of Conscious 0[x]  1[]  2[]  3[]      1b LOC Questions 0[x]  1[]  2[]       1c LOC Commands 0[x]  1[]  2[]       2 Best Gaze 0[]  1[x]  2[]       3 Visual 0[]  1[]  2[x]  3[]      4 Facial Palsy 0[]  1[x]  2[]  3[]      5a Motor Arm - left 0[]  1[x]  2[]  3[]  4[]  UN[]    5b Motor Arm - Right 0[x]  1[]  2[]  3[]  4[]  UN[]    6a Motor Leg - Left 0[x]  1[]  2[]  3[]  4[]  UN[]    6b Motor Leg - Right 0[x]  1[]  2[]  3[]  4[]  UN[]    7 Limb Ataxia 0[x]  1[]  2[]  UN[]      8 Sensory 0[]  1[]  2[x]  UN[]      9 Best Language 0[x]  1[]  2[]  3[]      10 Dysarthria 0[x]  1[]  2[]  UN[]      11 Extinct. and Inattention 0[]  1[]  2[x]       TOTAL: 9      Past History   Past Medical History:  Diagnosis Date   Cancer (HCC)    Diabetes mellitus  without complication (HCC)    Hyperlipemia    Past Surgical History:  Procedure Laterality Date   ABDOMINAL HYSTERECTOMY     BREAST SURGERY     History reviewed. No pertinent family history. Social History   Socioeconomic History   Marital status: Married    Spouse name: Not on file   Number of children: Not on file   Years of education: Not on file   Highest education level: Not on file  Occupational History   Not on file  Tobacco Use   Smoking status: Never   Smokeless tobacco: Never  Substance and Sexual Activity   Alcohol use: No   Drug use: No   Sexual activity: Yes    Birth control/protection: Surgical  Other Topics Concern   Not on file  Social History Narrative   Not on file   Social Drivers of Health   Financial Resource Strain: Not on file  Food Insecurity: Not on file  Transportation Needs: Not on file  Physical Activity: Not on file  Stress: Not on file  Social Connections: Not on file   Allergies  Allergen Reactions  Aspirin Other (See Comments)    Causes bleeding   Quinine Rash   Sulfamethoxazole Rash    Medications   Current Facility-Administered Medications:     stroke: early stages of recovery book, , Does not apply, Once, Michaela Aisha SQUIBB, MD   0.9 %  sodium chloride infusion, , Intravenous, Continuous, Jaxsyn Catalfamo, Aisha SQUIBB, MD   acetaminophen  (TYLENOL ) tablet 650 mg, 650 mg, Oral, Q4H PRN **OR** acetaminophen  (TYLENOL ) 160 MG/5ML solution 650 mg, 650 mg, Per Tube, Q4H PRN **OR** acetaminophen  (TYLENOL ) suppository 650 mg, 650 mg, Rectal, Q4H PRN, Michaela Aisha SQUIBB, MD   [START ON 04/27/2024] insulin aspart (novoLOG) injection 0-15 Units, 0-15 Units, Subcutaneous, Q4H, Daanish Copes, Aisha SQUIBB, MD   labetalol (NORMODYNE) injection 10 mg, 10 mg, Intravenous, Once PRN **AND** nicardipine (CARDENE) 20mg  in 0.86% saline 200ml IV infusion (0.1 mg/ml), 0-15 mg/hr, Intravenous, Continuous PRN, Michaela Aisha SQUIBB, MD   pantoprazole  (PROTONIX) injection 40 mg, 40 mg, Intravenous, QHS, Juno Alers, Aisha SQUIBB, MD   tenecteplase [facility]) injection for Stroke 20 mg, 0.25 mg/kg, Intravenous, Once, Michaela Aisha SQUIBB, MD  Current Outpatient Medications:    amLODipine (NORVASC) 10 MG tablet, Take 10 mg by mouth daily., Disp: , Rfl:    aspirin 81 MG tablet, Take 81 mg by mouth daily., Disp: , Rfl:    atorvastatin (LIPITOR) 20 MG tablet, Take 20 mg by mouth daily., Disp: , Rfl:    BD INSULIN SYRINGE U/F 31G X 5/16 0.5 ML MISC, See admin instructions., Disp: , Rfl:    bimatoprost (LUMIGAN) 0.01 % SOLN, Place 1 drop into both eyes at bedtime., Disp: , Rfl:    brimonidine (ALPHAGAN P) 0.1 % SOLN, Place 1 drop into both eyes 2 (two) times daily., Disp: , Rfl:    dorzolamide-timolol (COSOPT) 22.3-6.8 MG/ML ophthalmic solution, Place 1 drop into both eyes 2 (two) times daily., Disp: , Rfl:    FLUZONE HIGH-DOSE QUADRIVALENT 0.7 ML SUSY, , Disp: , Rfl:    furosemide (LASIX) 20 MG tablet, Take 20 mg by mouth daily., Disp: , Rfl:    HUMULIN 70/30 (70-30) 100 UNIT/ML injection, INJECT 30 UNITS SUBCUTANEOUSLY EVERY MORNING AND 5 UNITS IN THE EVENING, Disp: , Rfl:    HUMULIN N 100 UNIT/ML injection, Inject 10-30 Units into the skin 2 (two) times daily. 30 units every morning and 10 units every evening, Disp: , Rfl: 12   Lancets (ONETOUCH DELICA PLUS LANCET30G) MISC, 4 (four) times daily. for testing, Disp: , Rfl:    mupirocin  cream (BACTROBAN ) 2 %, Apply 1 application topically 2 (two) times daily., Disp: 15 g, Rfl: 0   ONETOUCH ULTRA test strip, 4 (four) times daily. for testing, Disp: , Rfl:    pilocarpine (PILOCAR) 4 % ophthalmic solution, Place 1 drop into the right eye 4 (four) times daily., Disp: , Rfl:    pregabalin (LYRICA) 25 MG capsule, Take 25 mg by mouth daily as needed (pain). , Disp: , Rfl:    timolol (TIMOPTIC) 0.5 % ophthalmic solution, Place 1 drop into both eyes at bedtime. , Disp: , Rfl:    traMADol  (ULTRAM ) 50 MG tablet,  Take 1 tablet (50 mg total) by mouth every 6 (six) hours as needed., Disp: 15 tablet, Rfl: 0   valsartan-hydrochlorothiazide (DIOVAN-HCT) 160-12.5 MG tablet, Take 1 tablet by mouth daily., Disp: , Rfl: 3   Vitals   Vitals:   04/26/24 2100 04/26/24 2138 04/26/24 2200 04/26/24 2204  BP:  (!) 155/79 138/64   Pulse:  78 79  Resp:  18 18   SpO2:  100% 100%   Weight: 78.2 kg     Height:    5' 10 (1.778 m)     Body mass index is 24.74 kg/m.  Physical Exam   Constitutional: Appears well-developed and well-nourished.  Psych: Affect appropriate to situation.  Eyes: No scleral injection.  HENT: No OP obstruction.  Head: Normocephalic.  Cardiovascular: Normal rate and regular rhythm.  Respiratory: Effort normal, non-labored breathing.  GI: Soft.  No distension. There is no tenderness.  Skin: WDI.   Neurologic Examination    Neuro: Mental Status: Patient is awake, alert, oriented to person, place, month, year, and situation. Patient is able to give a clear and coherent history. No signs of aphasia She has severe left hemineglect Cranial Nerves: II: She has no light perception and no pupillary reaction to direct stimulation but she does have constriction to contralateral light stimulation in the right eye, and the left eye her pupil is round and reactive to light.  She has an intact nasal field with no vision past midline on the left in her left eye. III,IV, VI: She has a right gaze preference, initially is unable to cross midline, but I do see her cross midline a single time. V: Facial sensation is diminished in the left VII: Facial movement with left lower facial weakness VIII: hearing is intact to voice X: Uvula elevates symmetrically XII: tongue is midline without atrophy or fasciculations.  Motor: Tone is normal. Bulk is normal. 5/5 strength was present in all four extremities.  Sensory: Sensation is symmetric to light touch and temperature in the arms and  legs. Cerebellar: No clear ataxia    Labs   CBC:  Recent Labs  Lab 04/26/24 2133 04/26/24 2137  WBC 6.3  --   NEUTROABS 3.3  --   HGB 10.5* 11.2*  HCT 32.7* 33.0*  MCV 100.9*  --   PLT 198  --    Basic Metabolic Panel:  Lab Results  Component Value Date   NA 140 04/26/2024   K 4.4 04/26/2024   CO2 20 (L) 04/26/2024   GLUCOSE 258 (H) 04/26/2024   BUN 31 (H) 04/26/2024   CREATININE 1.10 (H) 04/26/2024   CALCIUM  9.2 04/26/2024   GFRNONAA 45 (L) 04/26/2024     Alcohol Level     Component Value Date/Time   [facility] <15 04/26/2024 2133   INR  Lab Results  Component Value Date   INR 1.1 04/26/2024   APTT  Lab Results  Component Value Date   APTT 28 04/26/2024     CT Head without contrast(Personally reviewed): Negative  CT angio Head and Neck with contrast(Personally reviewed): Proximal M2 occlusion  Assessment   Valia Wingard is a 89 y.o. female with acute right M2 occlusion.  She was within the time window for IV tenecteplase.  Given her age, I did have a longer discussion with her regarding the possibility of thrombectomy.  Initially, she did not want to make a decision and therefore we awaited her husband's arrival and again discussed thrombectomy with the patient and her husband.  We are clear that at 62, there was significant risk, but given the degree of her neglect, I felt that she would likely be significantly more disabled.  Her desire is to remain as independent as possible, and to avail herself of any intervention that might preserve her independence.  After discussion of the risks and possible benefits of thrombectomy with myself and Dr. Janjua, the patient  and her husband agreed to proceed.   Risks, benefits and alternatives of IVT discussed with patient and/or family and they agreed. CTH personally reviewed prior to TNK administration  Primary Diagnosis:  Cerebral infarction due to occlusion or stenosis of right middle cerebral artery.   Secondary  Diagnosis: Essential (primary) hypertension, Type 2 diabetes mellitus with hyperglycemia , and CKD Stage 3 (GFR 30-59)  Recommendations  - HgbA1c, fasting lipid panel - MRI of the brain without contrast - Frequent neuro checks - Echocardiogram - CTA head and neck - Prophylactic therapy-none for 24 hours - Risk factor modification - Telemetry monitoring - PT consult, OT consult, Speech consult - BP goal determined by outcome of thrombectomy.  - SSI for DM - Stroke team to follow  ______________________________________________________________________  This patient is critically ill and at significant risk of neurological worsening, death and care requires constant monitoring of vital signs, hemodynamics,respiratory and cardiac monitoring, neurological assessment, discussion with family, other specialists and medical decision making of high complexity. I spent 65 minutes of neurocritical care time  in the care of  this patient. This was time spent independent of any time provided by nurse practitioner or PA.  Aisha Seals, MD [facility]   If 7pm- 7am, please page neurology on call as listed in AMION. 04/26/2024  10:45 PM

## 2024-04-26 NOTE — Anesthesia Preprocedure Evaluation (Signed)
 Anesthesia Evaluation  Patient identified by MRN, date of birth, ID band Patient awake    Reviewed: Allergy & Precautions, NPO status , Patient's Chart, lab work & pertinent test resultsPreop documentation limited or incomplete due to emergent nature of procedure.  History of Anesthesia Complications Negative for: history of anesthetic complications  Airway Mallampati: II  TM Distance: >3 FB     Dental  (+) Upper Dentures, Lower Dentures   Pulmonary neg COPD   breath sounds clear to auscultation       Cardiovascular (-) CAD, (-) Past MI and (-) Cardiac Stents  Rhythm:Regular Rate:Normal     Neuro/Psych neg Seizures      Dementia CVA, Residual Symptoms    GI/Hepatic   Endo/Other  diabetes, Type 2    Renal/GU CRFRenal disease     Musculoskeletal   Abdominal   Peds  Hematology   Anesthesia Other Findings   Reproductive/Obstetrics                              Anesthesia Physical Anesthesia Plan  ASA: 3 and emergent  Anesthesia Plan: General   Post-op Pain Management:    Induction: Intravenous  PONV Risk Score and Plan: 2  Airway Management Planned: Oral ETT  Additional Equipment:   Intra-op Plan:   Post-operative Plan: Extubation in OR  Informed Consent: I have reviewed the patients History and Physical, chart, labs and discussed the procedure including the risks, benefits and alternatives for the proposed anesthesia with the patient or authorized representative who has indicated his/her understanding and acceptance.     Dental advisory given  Plan Discussed with: CRNA  Anesthesia Plan Comments:          Anesthesia Quick Evaluation

## 2024-04-26 NOTE — ED Triage Notes (Signed)
 Pt bib EMS from home after patient's husband noticed that patient started having weakness on L side, difficulty standing/walking, and wasn't able to look to her left side. LKW: 1800. Pt awake and alert, answering questions appropriately. EMS reports fall after symptoms began, denies patient hitting head, no LOC. (-) thinners.

## 2024-04-26 NOTE — ED Provider Notes (Signed)
  PERIOPERATIVE AREA Provider Note   CSN: [id number] Arrival date & time: 04/26/24  2131  An emergency department physician performed an initial assessment on this suspected stroke patient at 2133.  Patient presents with: Code Stroke   Joann Anderson is a 89 y.o. female.   HPI     89 y.o. female with hx of diabetes, hypercholesterolemia, CKD 3a, essential hypertension presents with left-sided numbness, left facial droop and left-sided neglect that started abruptly earlier this evening (estimated last normal 6 pm).   Patient came in as code stroke.  Patient able to speak per EMS.  Patient did fall, she is not on any blood thinners.  Prior to Admission medications   Medication Sig Start Date End Date Taking? Authorizing Provider  amLODipine (NORVASC) 10 MG tablet Take 10 mg by mouth daily. 07/19/12   [provider]  aspirin 81 MG tablet Take 81 mg by mouth daily.    [provider]  atorvastatin (LIPITOR) 20 MG tablet Take 20 mg by mouth daily. 12/19/12   [provider]  BD INSULIN SYRINGE U/F 31G X 5/16 0.5 ML MISC See admin instructions. 07/13/19   [provider]  bimatoprost (LUMIGAN) 0.01 % SOLN Place 1 drop into both eyes at bedtime. 03/24/15   [provider]  brimonidine (ALPHAGAN P) 0.1 % SOLN Place 1 drop into both eyes 2 (two) times daily. 03/24/15   [provider]  dorzolamide-timolol (COSOPT) 22.3-6.8 MG/ML ophthalmic solution Place 1 drop into both eyes 2 (two) times daily. 09/16/15   [provider]  FLUZONE HIGH-DOSE QUADRIVALENT 0.7 ML SUSY  02/26/19   [provider]  furosemide (LASIX) 20 MG tablet Take 20 mg by mouth daily. 11/18/18   [provider]  HUMULIN 70/30 (70-30) 100 UNIT/ML injection INJECT 30 UNITS SUBCUTANEOUSLY EVERY MORNING AND 5 UNITS IN THE EVENING 02/27/19   [provider]  HUMULIN N 100 UNIT/ML injection Inject 10-30 Units into the skin 2 (two) times  daily. 30 units every morning and 10 units every evening 08/22/15   [provider]  Lancets (ONETOUCH DELICA PLUS LANCET30G) MISC 4 (four) times daily. for testing 05/08/19   [provider]  mupirocin  cream (BACTROBAN ) 2 % Apply 1 application topically 2 (two) times daily. 09/19/15   Essie Stephens GRADE, PA-C  ONETOUCH ULTRA test strip 4 (four) times daily. for testing 03/31/19   [provider]  pilocarpine (PILOCAR) 4 % ophthalmic solution Place 1 drop into the right eye 4 (four) times daily. 04/06/19   [provider]  pregabalin (LYRICA) 25 MG capsule Take 25 mg by mouth daily as needed (pain).  08/03/12   [provider]  timolol (TIMOPTIC) 0.5 % ophthalmic solution Place 1 drop into both eyes at bedtime.  01/31/17   [provider]  traMADol  (ULTRAM ) 50 MG tablet Take 1 tablet (50 mg total) by mouth every 6 (six) hours as needed. 02/21/17   Tegeler, Lonni PARAS, MD  valsartan-hydrochlorothiazide (DIOVAN-HCT) 160-12.5 MG tablet Take 1 tablet by mouth daily. 09/01/15   [provider]    Allergies: Aspirin, Quinine, and Sulfamethoxazole    Review of Systems  Updated Vital Signs BP (!) 161/67   Pulse 79   Resp 17   Ht 5' 10 (1.778 m)   Wt 78.2 kg   SpO2 100%   BMI 24.74 kg/m   Physical Exam Vitals and nursing note reviewed.  HENT:     Head: Atraumatic.  Cardiovascular:  Rate and Rhythm: Tachycardia present.  Pulmonary:     Effort: Pulmonary effort is normal.  Neurological:     Mental Status: She is alert.     (all labs ordered are listed, but only abnormal results are displayed) Labs Reviewed  CBC - Abnormal; Notable for the following components:      Result Value   RBC 3.24 (*)    Hemoglobin 10.5 (*)    HCT 32.7 (*)    MCV 100.9 (*)    All other components within normal limits  COMPREHENSIVE METABOLIC PANEL WITH GFR - Abnormal; Notable for the following components:   CO2 20 (*)    Glucose, Bld 256 (*)     BUN 29 (*)    Creatinine, Ser 1.14 (*)    GFR, Estimated 45 (*)    All other components within normal limits  I-STAT CHEM 8, ED - Abnormal; Notable for the following components:   BUN 31 (*)    Creatinine, Ser 1.10 (*)    Glucose, Bld 258 (*)    TCO2 20 (*)    Hemoglobin 11.2 (*)    HCT 33.0 (*)    All other components within normal limits  CBG MONITORING, ED - Abnormal; Notable for the following components:   Glucose-Capillary 242 (*)    All other components within normal limits  PROTIME-INR  APTT  DIFFERENTIAL  ETHANOL  LIPID PANEL  HEMOGLOBIN A1C    EKG: None  Radiology: CT ANGIO HEAD NECK W WO CM (CODE STROKE) Result Date: 04/26/2024 EXAM: CTA HEAD AND NECK WITHOUT AND WITH 04/26/2024 09:51:48 PM TECHNIQUE: CTA of the head and neck was performed without and with the administration of intravenous contrast. Multiplanar 2D and/or 3D reformatted images are provided for review. Automated exposure control, iterative reconstruction, and/or weight based adjustment of the mA/kV was utilized to reduce the radiation dose to as low as reasonably achievable. Stenosis of the internal carotid arteries measured using NASCET criteria. COMPARISON: Same day CT head. CLINICAL HISTORY: Neuro deficit, acute, stroke suspected. Left sided weakness, right gaze; Stroke alert; FINDINGS: AORTIC ARCH AND ARCH VESSELS: No dissection or arterial injury. No significant stenosis of the brachiocephalic or subclavian arteries. CERVICAL CAROTID ARTERIES: No dissection, arterial injury, or hemodynamically significant stenosis by NASCET criteria. CERVICAL VERTEBRAL ARTERIES: No dissection, arterial injury, or significant stenosis. LUNGS AND MEDIASTINUM: Unremarkable. SOFT TISSUES: Enalrged multinodular thyroid  further evaluated on nuclear medicine study from 03/07/2002. BONES: No acute abnormality. ANTERIOR CIRCULATION: No significant stenosis of the internal carotid arteries. No significant stenosis of the anterior  cerebral arteries. Proximal right M2 MCA occlusion. No aneurysm. POSTERIOR CIRCULATION: No significant stenosis of the posterior cerebral arteries. No significant stenosis of the basilar artery. No significant stenosis of the vertebral arteries. No aneurysm. OTHER: No dural venous sinus thrombosis on this non-dedicated study. IMPRESSION: 1. Proximal right M2 MCA occlusion. Findings discussed with Dr. Michaela via telephone at 10:00 PM Electronically signed by: Gilmore Molt MD 04/26/2024 10:05 PM EDT RP Workstation: [id number]   CT HEAD CODE STROKE WO CONTRAST Result Date: 04/26/2024 EXAM: CT HEAD WITHOUT 04/26/2024 09:43:48 PM TECHNIQUE: CT of the head was performed without the administration of intravenous contrast. Automated exposure control, iterative reconstruction, and/or weight based adjustment of the mA/kV was utilized to reduce the radiation dose to as low as reasonably achievable. COMPARISON: None available. CLINICAL HISTORY: Neuro deficit, acute, stroke suspected. Left sided weakness, right gaze; Stroke alery; Neuro: kirkpatrick: [phone] FINDINGS: BRAIN AND VENTRICLES: No acute intracranial hemorrhage. No mass effect or  midline shift. No extra-axial fluid collection. No evidence of acute infarct. No hydrocephalus. ORBITS: No acute abnormality. SINUSES AND MASTOIDS: No acute abnormality. SOFT TISSUES AND SKULL: No acute skull fracture. No acute soft tissue abnormality. Findings discussed with Dr. Michaela via telephone at 9:48 PM. IMPRESSION: 1. No acute intracranial abnormality. Electronically signed by: Gilmore Molt MD 04/26/2024 09:49 PM EDT RP Workstation: [id number]     .Critical Care  Performed by: Charlyn Sora, MD Authorized by: Charlyn Sora, MD   Critical care provider statement:    Critical care time (minutes):  35   Critical care was necessary to treat or prevent imminent or life-threatening deterioration of the following conditions:  CNS failure or compromise    Critical care was time spent personally by me on the following activities:  Discussions with consultants, examination of patient, ordering and review of laboratory studies, ordering and review of radiographic studies, ordering and performing treatments and interventions, review of old charts and obtaining history from patient or surrogate    Medications Ordered in the ED   stroke: early stages of recovery book ( Does not apply MAR Hold 04/26/24 2329)  0.9 %  sodium chloride infusion (has no administration in time range)  acetaminophen  (TYLENOL ) tablet 650 mg ( Oral MAR Hold 04/26/24 2329)    Or  acetaminophen  (TYLENOL ) 160 MG/5ML solution 650 mg ( Per Tube MAR Hold 04/26/24 2329)    Or  acetaminophen  (TYLENOL ) suppository 650 mg ( Rectal MAR Hold 04/26/24 2329)  labetalol (NORMODYNE) injection 10 mg ( Intravenous MAR Hold 04/26/24 2329)    And  nicardipine (CARDENE) 20mg  in 0.86% saline IV infusion (0.1 mg/ml) ( Intravenous MAR Hold 04/26/24 2329)  pantoprazole (PROTONIX) injection 40 mg ( Intravenous Automatically Held 05/04/24 2200)  insulin aspart (novoLOG) injection 0-15 Units ( Subcutaneous Automatically Held 05/05/24 2000)  oxyCODONE (Oxy IR/ROXICODONE) immediate release tablet 5 mg (has no administration in time range)    Or  oxyCODONE (ROXICODONE) 5 MG/5ML solution 5 mg (has no administration in time range)  fentaNYL (SUBLIMAZE) injection 25-50 mcg (has no administration in time range)  acetaminophen  (OFIRMEV ) IV 1,000 mg (has no administration in time range)  ondansetron  (ZOFRAN ) injection 4 mg (has no administration in time range)  sodium chloride flush (NS) 0.9 % injection 3 mL (3 mLs Intravenous Given 04/26/24 2212)  tenecteplase (TNKASE) injection for Stroke 20 mg ( Intravenous MAR Hold 04/26/24 2329)  iohexol (OMNIPAQUE) 350 MG/ML injection 75 mL (75 mLs Intravenous Contrast Given 04/26/24 2153)  iohexol (OMNIPAQUE) 300 MG/ML solution 150 mL (25 mLs Intra-arterial Contrast  Given 04/26/24 2338)                                    Medical Decision Making Amount and/or Complexity of Data Reviewed Labs: ordered. Radiology: ordered.  Risk Decision regarding hospitalization.   89 year old female comes in with chief complaint of acute left-sided weakness.  Patient came in as a code stroke.  Neurology team at the bedside.  Exam was limited because of the acuity, and we deferred further evaluation by neurology.  Patient taken for CAT scan.  She is protecting airway.  CT angiogram revealed that patient had acute M2 occlusion, and eventually neurology was able to coordinate thrombectomy with neurointerventional team.  I have reviewed patient's electronic health records and also patient's medications.  Final diagnoses:  Acute ischemic stroke [facility])    ED Discharge Orders  None          Charlyn Sora, MD 04/26/24 [phone]

## 2024-04-26 NOTE — Brief Op Note (Signed)
  NEUROSURGERY BRIEF THROMBECTOMY NOTE   PREOP DX: RIGHT M2-3 occlusion - Stroke  POSTOP DX: Same  PROCEDURE: RIGHT MCA Thrombectomy  SURGEON: Dino Sable   ANESTHESIA: GETA  EBL: Minimal  Number of Passes: 1  Technique: ASPIRATION  Final TICI score: 3  Post OP blood pressure goal: SBP<160  Arterial Angioplasty or Stent: No   Anti-Platelet Therapy: No   COMPLICATIONS: No   CONDITION: Stable to recovery  FINDINGS (Full report in [facility]): 1. RIGHT M2-3 occlusion. TICI  III    Joann Anderson  @today @ 11:17 PM

## 2024-04-26 NOTE — Anesthesia Procedure Notes (Signed)
 Procedure Name: Intubation Date/Time: 04/26/2024 10:41 PM  Performed by: Roddie Grate, CRNAPre-anesthesia Checklist: Patient identified, Emergency Drugs available, Suction available, Patient being monitored and Timeout performed Patient Re-evaluated:Patient Re-evaluated prior to induction Oxygen Delivery Method: Circle system utilized Preoxygenation: Pre-oxygenation with 100% oxygen Induction Type: IV induction, Rapid sequence and Cricoid Pressure applied Laryngoscope Size: Mac and 3 Grade View: Grade I Tube type: Oral Tube size: 7.0 mm Number of attempts: 1 Airway Equipment and Method: Stylet Placement Confirmation: ETT inserted through vocal cords under direct vision, positive ETCO2 and breath sounds checked- equal and bilateral Secured at: 22 cm Tube secured with: Tape Dental Injury: Teeth and Oropharynx as per pre-operative assessment  Comments: Smooth IV Induction. Eyes taped. RSI performed. DL x 1 with grade 1 view. Atraumatically placed, teeth and lip remain intact as pre-op. Secured with tape. Bilateral breath sounds +/=, EtCO2 +, Adequate TV, VSS.

## 2024-04-26 NOTE — ED Notes (Signed)
 Pt taken to IR and bedside report given to the nurse.

## 2024-04-26 NOTE — Transfer of Care (Signed)
 Immediate Anesthesia Transfer of Care Note  Patient: Joann Anderson  Procedure(s) Performed: RADIOLOGY WITH ANESTHESIA  Patient Location: PACU  Anesthesia Type:General  Level of Consciousness: awake, alert , and oriented  Airway & Oxygen Therapy: Patient Spontanous Breathing and Patient connected to nasal cannula oxygen  Post-op Assessment: Report given to RN, Post -op Vital signs reviewed and stable, and Patient moving all extremities  Post vital signs: Reviewed and stable  Last Vitals:  Vitals Value Taken Time  BP    Temp    Pulse    Resp    SpO2      Last Pain:  Vitals:   04/26/24 2204  PainSc: 0-No pain         Complications: No notable events documented.

## 2024-04-26 NOTE — Progress Notes (Signed)
 PHARMACIST CODE STROKE RESPONSE  Notified to mix TNK at 9:41pm by Dr. Michaela.  TNK preparation completed at 9:44pm.   TNK dose = 20 mg IV over 5 seconds.   Issues/delays encountered (if applicable): None  Maurilio Patten, PharmD PGY1 Pharmacy Resident [facility] 04/26/2024 10:04 PM

## 2024-04-27 ENCOUNTER — Encounter (HOSPITAL_COMMUNITY): Payer: Self-pay | Admitting: Radiology

## 2024-04-27 ENCOUNTER — Other Ambulatory Visit: Payer: Self-pay | Admitting: Cardiology

## 2024-04-27 ENCOUNTER — Inpatient Hospital Stay (HOSPITAL_COMMUNITY)

## 2024-04-27 DIAGNOSIS — I6389 Other cerebral infarction: Secondary | ICD-10-CM

## 2024-04-27 DIAGNOSIS — E1151 Type 2 diabetes mellitus with diabetic peripheral angiopathy without gangrene: Secondary | ICD-10-CM | POA: Diagnosis not present

## 2024-04-27 DIAGNOSIS — E1165 Type 2 diabetes mellitus with hyperglycemia: Secondary | ICD-10-CM | POA: Diagnosis not present

## 2024-04-27 DIAGNOSIS — E1122 Type 2 diabetes mellitus with diabetic chronic kidney disease: Secondary | ICD-10-CM | POA: Diagnosis not present

## 2024-04-27 DIAGNOSIS — I639 Cerebral infarction, unspecified: Secondary | ICD-10-CM | POA: Diagnosis not present

## 2024-04-27 DIAGNOSIS — E785 Hyperlipidemia, unspecified: Secondary | ICD-10-CM

## 2024-04-27 DIAGNOSIS — G9389 Other specified disorders of brain: Secondary | ICD-10-CM | POA: Diagnosis not present

## 2024-04-27 DIAGNOSIS — I63411 Cerebral infarction due to embolism of right middle cerebral artery: Secondary | ICD-10-CM | POA: Diagnosis not present

## 2024-04-27 DIAGNOSIS — R220 Localized swelling, mass and lump, head: Secondary | ICD-10-CM | POA: Diagnosis not present

## 2024-04-27 LAB — MRSA NEXT GEN BY PCR, NASAL: MRSA by PCR Next Gen: NOT DETECTED

## 2024-04-27 LAB — LIPID PANEL
Cholesterol: 120 mg/dL (ref 0–200)
HDL: 47 mg/dL (ref >40)
LDL Cholesterol: 63 mg/dL (ref 0–99)
Total CHOL/HDL Ratio: 2.6 ratio
Triglycerides: 51 mg/dL (ref <150)
VLDL: 10 mg/dL (ref 0–40)

## 2024-04-27 LAB — GLUCOSE, CAPILLARY
Glucose-Capillary: 188 mg/dL — ABNORMAL HIGH (ref 70–99)
Glucose-Capillary: 191 mg/dL — ABNORMAL HIGH (ref 70–99)
Glucose-Capillary: 210 mg/dL — ABNORMAL HIGH (ref 70–99)
Glucose-Capillary: 259 mg/dL — ABNORMAL HIGH (ref 70–99)
Glucose-Capillary: 282 mg/dL — ABNORMAL HIGH (ref 70–99)
Glucose-Capillary: 343 mg/dL — ABNORMAL HIGH (ref 70–99)
Glucose-Capillary: 69 mg/dL — ABNORMAL LOW (ref 70–99)
Glucose-Capillary: 72 mg/dL (ref 70–99)

## 2024-04-27 LAB — ECHOCARDIOGRAM COMPLETE
Area-P 1/2: 4.57 cm2
Height: 70 in
S' Lateral: 2.8 cm
Weight: 2758.4 [oz_av]

## 2024-04-27 LAB — HEMOGLOBIN A1C
Hgb A1c MFr Bld: 5.8 % — ABNORMAL HIGH (ref 4.8–5.6)
Mean Plasma Glucose: 119.76 mg/dL

## 2024-04-27 MED ORDER — BRIMONIDINE TARTRATE 0.2 % OP SOLN
1.0000 [drp] | Freq: Two times a day (BID) | OPHTHALMIC | Status: DC
  Administered 2024-04-27: 1 [drp] via OPHTHALMIC
  Filled 2024-04-27: qty 5

## 2024-04-27 MED ORDER — ATORVASTATIN CALCIUM 10 MG PO TABS
20.0000 mg | ORAL_TABLET | Freq: Every day | ORAL | Status: DC
  Administered 2024-04-27 – 2024-04-28 (×2): 20 mg via ORAL
  Filled 2024-04-27 (×2): qty 2

## 2024-04-27 MED ORDER — PILOCARPINE HCL 4 % OP SOLN
1.0000 [drp] | Freq: Four times a day (QID) | OPHTHALMIC | Status: DC
  Administered 2024-04-27 (×2): 1 [drp] via OPHTHALMIC
  Filled 2024-04-27: qty 15

## 2024-04-27 MED ORDER — DORZOLAMIDE HCL-TIMOLOL MAL 2-0.5 % OP SOLN
1.0000 [drp] | Freq: Two times a day (BID) | OPHTHALMIC | Status: DC
Start: 2024-04-27 — End: 2024-04-28
  Administered 2024-04-27 – 2024-04-28 (×2): 1 [drp] via OPHTHALMIC
  Filled 2024-04-27: qty 10

## 2024-04-27 MED ORDER — LATANOPROST 0.005 % OP SOLN
1.0000 [drp] | Freq: Every day | OPHTHALMIC | Status: DC
  Filled 2024-04-27: qty 2.5

## 2024-04-27 MED ORDER — DORZOLAMIDE HCL-TIMOLOL MAL 2-0.5 % OP SOLN
1.0000 [drp] | Freq: Two times a day (BID) | OPHTHALMIC | Status: DC
  Administered 2024-04-27: 1 [drp] via OPHTHALMIC
  Filled 2024-04-27: qty 10

## 2024-04-27 MED ORDER — ASPIRIN 325 MG PO TABS
325.0000 mg | ORAL_TABLET | Freq: Every day | ORAL | Status: DC
  Administered 2024-04-27: 325 mg via ORAL
  Filled 2024-04-27: qty 1

## 2024-04-27 MED ORDER — ORAL CARE MOUTH RINSE: 15.0000 mL | OROMUCOSAL | Status: DC | PRN

## 2024-04-27 MED ORDER — TIMOLOL MALEATE 0.5 % OP SOLN
1.0000 [drp] | Freq: Every day | OPHTHALMIC | Status: DC
  Filled 2024-04-27: qty 5

## 2024-04-27 MED ORDER — ORAL CARE MOUTH RINSE
15.0000 mL | OROMUCOSAL | Status: DC
  Administered 2024-04-27 – 2024-04-28 (×5): 15 mL via OROMUCOSAL

## 2024-04-27 MED ORDER — CHLORHEXIDINE GLUCONATE CLOTH 2 % EX PADS
6.0000 | MEDICATED_PAD | Freq: Every day | CUTANEOUS | Status: DC
  Administered 2024-04-27 – 2024-04-28 (×2): 6 via TOPICAL

## 2024-04-27 MED ORDER — CLEVIDIPINE BUTYRATE 0.5 MG/ML IV EMUL: 0.0000 mg/h | INTRAVENOUS | Status: DC

## 2024-04-27 MED ORDER — INSULIN GLARGINE-YFGN 100 UNIT/ML ~~LOC~~ SOLN
10.0000 [IU] | Freq: Every day | SUBCUTANEOUS | Status: DC
  Administered 2024-04-27 – 2024-04-28 (×2): 10 [IU] via SUBCUTANEOUS
  Filled 2024-04-27 (×2): qty 0.1

## 2024-04-27 MED ORDER — SODIUM CHLORIDE 0.9 % IV BOLUS: 250.0000 mL | INTRAVENOUS | Status: AC | PRN

## 2024-04-27 MED ORDER — INSULIN ASPART 100 UNIT/ML IJ SOLN
0.0000 [IU] | INTRAMUSCULAR | Status: DC
  Administered 2024-04-27: 11 [IU] via SUBCUTANEOUS
  Administered 2024-04-27: 15 [IU] via SUBCUTANEOUS
  Administered 2024-04-27: 11 [IU] via SUBCUTANEOUS
  Administered 2024-04-28: 4 [IU] via SUBCUTANEOUS
  Administered 2024-04-28: 3 [IU] via SUBCUTANEOUS

## 2024-04-27 MED ORDER — GATIFLOXACIN 0.5 % OP SOLN
1.0000 [drp] | Freq: Four times a day (QID) | OPHTHALMIC | Status: DC
  Administered 2024-04-27 – 2024-04-28 (×3): 1 [drp] via OPHTHALMIC
  Filled 2024-04-27: qty 2.5

## 2024-04-27 NOTE — Progress Notes (Signed)
 STROKE TEAM PROGRESS NOTE   BRIEF HPI [name] is a 89 y.o. female with history of HTN, DM, HLD, CKD3a presenting with acute onset of left-sided numbness, left facial droop and left-sided neglect .   SIGNIFICANT HOSPITAL EVENTS 10/23: CTA: Right M2 occlusion.  TNK given @ 0944 10/24: MRI with small/subtle acute infarcts in the right posterior MCA/PCA watershed territory  INTERIM HISTORY/SUBJECTIVE Patient is awake in ICU bed this morning.  Her niece is at bedside as well as cardiology technician completing echocardiogram.  Neuro exam is largely nonfocal with some right eye peripheral vision impairments felt to be her visual baseline. Patient passed bedside swallow this morning, diet ordered.   OBJECTIVE CBC    Component Value Date/Time   WBC 6.3 04/26/2024 2133   RBC 3.24 (L) 04/26/2024 2133   HGB 11.2 (L) 04/26/2024 2137   HGB 12.1 04/01/2006 0812   HCT 33.0 (L) 04/26/2024 2137   HCT 35.4 04/01/2006 0812   PLT 198 04/26/2024 2133   PLT 209 04/01/2006 0812   MCV 100.9 (H) 04/26/2024 2133   MCV 97.7 04/01/2006 0812   MCH 32.4 04/26/2024 2133   MCHC 32.1 04/26/2024 2133   RDW 11.7 04/26/2024 2133   RDW 12.8 04/01/2006 0812   LYMPHSABS 1.6 04/26/2024 2133   LYMPHSABS 1.6 04/01/2006 0812   MONOABS 0.9 04/26/2024 2133   MONOABS 0.5 04/01/2006 0812   EOSABS 0.5 04/26/2024 2133   EOSABS 0.2 04/01/2006 0812   BASOSABS 0.0 04/26/2024 2133   BASOSABS 0.0 04/01/2006 0812   BMET    Component Value Date/Time   NA 140 04/26/2024 2137   K 4.4 04/26/2024 2137   CL 107 04/26/2024 2137   CO2 20 (L) 04/26/2024 2133   GLUCOSE 258 (H) 04/26/2024 2137   BUN 31 (H) 04/26/2024 2137   CREATININE 1.10 (H) 04/26/2024 2137   CALCIUM  9.2 04/26/2024 2133   GFRNONAA 45 (L) 04/26/2024 2133  No results found for: HGBA1C No results found for: CHOL, HDL, LDLCALC, LDLDIRECT, TRIG, CHOLHDL  IMAGING past 24 hours MR BRAIN WO CONTRAST Result Date: 04/27/2024 EXAM: MRI BRAIN  WITHOUT CONTRAST 04/27/2024 06:17:00 AM TECHNIQUE: Multiplanar multisequence MRI of the head/brain was performed without the administration of intravenous contrast. COMPARISON: Head CT and CTA dated 04/26/2024. CLINICAL HISTORY: 89 year old female with code stroke presentation, right MCA occlusion, and status post endovascular revascularization. FINDINGS: BRAIN AND VENTRICLES: Subtle cortical and subcortical white matter patchy and punctate diffusion restriction in the posterior right hemisphere, right superior and lateral occipital lobe compatible with posterior MCA / PCA watershed ischemia. No other convincing diffusion restriction. Faint T2 and FLAIR hyperintensity associated with the areas of abnormal diffusion no hemorrhagic transformation or mass effect. Small number of chronic microhemorrhages in the brain, most apparent in the left cerebellar hemisphere on series 12 image 18. Scattered and patchy cerebral white matter T2 and FLAIR hyperintensity. Mild for age T2 heterogeneity in the deep gray matter nuclei and brainstem. Multiple small chronic bilateral cerebellar infarcts (series 10 image 7). Nonspecific ventriculomegaly which may be ex vacuo in nature. Cavum septum lucidum, normal variant. No mass. No midline shift. The sella is unremarkable. Major vascular flow voids are preserved. ORBITS: No acute abnormality. SINUSES AND MASTOIDS: No acute abnormality. BONES AND SOFT TISSUES: Normal marrow signal. Left posterior convexity scalp hematoma or contusion series 11 image 42. IMPRESSION: 1. Small / subtle acute infarcts right posterior MCA/PCA watershed, without hemorrhagic transformation or mass effect. 2. Left posterior convexity scalp soft tissue injury. 3. Chronic small  vessel disease, including in the bilateral cerebellum. Electronically signed by: Helayne Hurst MD 04/27/2024 06:32 AM EDT RP Workstation: [id number]   CT ANGIO HEAD NECK W WO CM (CODE STROKE) Result Date: 04/26/2024 EXAM: CTA HEAD AND  NECK WITHOUT AND WITH 04/26/2024 09:51:48 PM TECHNIQUE: CTA of the head and neck was performed without and with the administration of intravenous contrast. Multiplanar 2D and/or 3D reformatted images are provided for review. Automated exposure control, iterative reconstruction, and/or weight based adjustment of the mA/kV was utilized to reduce the radiation dose to as low as reasonably achievable. Stenosis of the internal carotid arteries measured using NASCET criteria. COMPARISON: Same day CT head. CLINICAL HISTORY: Neuro deficit, acute, stroke suspected. Left sided weakness, right gaze; Stroke alert; FINDINGS: AORTIC ARCH AND ARCH VESSELS: No dissection or arterial injury. No significant stenosis of the brachiocephalic or subclavian arteries. CERVICAL CAROTID ARTERIES: No dissection, arterial injury, or hemodynamically significant stenosis by NASCET criteria. CERVICAL VERTEBRAL ARTERIES: No dissection, arterial injury, or significant stenosis. LUNGS AND MEDIASTINUM: Unremarkable. SOFT TISSUES: Enalrged multinodular thyroid  further evaluated on nuclear medicine study from 03/07/2002. BONES: No acute abnormality. ANTERIOR CIRCULATION: No significant stenosis of the internal carotid arteries. No significant stenosis of the anterior cerebral arteries. Proximal right M2 MCA occlusion. No aneurysm. POSTERIOR CIRCULATION: No significant stenosis of the posterior cerebral arteries. No significant stenosis of the basilar artery. No significant stenosis of the vertebral arteries. No aneurysm. OTHER: No dural venous sinus thrombosis on this non-dedicated study. IMPRESSION: 1. Proximal right M2 MCA occlusion. Findings discussed with Dr. Michaela via telephone at 10:00 PM Electronically signed by: Gilmore Molt MD 04/26/2024 10:05 PM EDT RP Workstation: [id number]   CT HEAD CODE STROKE WO CONTRAST Result Date: 04/26/2024 EXAM: CT HEAD WITHOUT 04/26/2024 09:43:48 PM TECHNIQUE: CT of the head was performed without the  administration of intravenous contrast. Automated exposure control, iterative reconstruction, and/or weight based adjustment of the mA/kV was utilized to reduce the radiation dose to as low as reasonably achievable. COMPARISON: None available. CLINICAL HISTORY: Neuro deficit, acute, stroke suspected. Left sided weakness, right gaze; Stroke alery; Neuro: kirkpatrick: [phone] FINDINGS: BRAIN AND VENTRICLES: No acute intracranial hemorrhage. No mass effect or midline shift. No extra-axial fluid collection. No evidence of acute infarct. No hydrocephalus. ORBITS: No acute abnormality. SINUSES AND MASTOIDS: No acute abnormality. SOFT TISSUES AND SKULL: No acute skull fracture. No acute soft tissue abnormality. Findings discussed with Dr. Michaela via telephone at 9:48 PM. IMPRESSION: 1. No acute intracranial abnormality. Electronically signed by: Gilmore Molt MD 04/26/2024 09:49 PM EDT RP Workstation: [id number]   Vitals:   04/27/24 0600 04/27/24 0615 04/27/24 0630 04/27/24 0700  BP: (!) 166/71 139/83 (!) 146/74 (!) 152/65  Pulse: 78  76 77  Resp: 17  10 17   Temp:      TempSrc:      SpO2: 97%  99% 96%  Weight:      Height:       PHYSICAL EXAM General:  Alert, well-nourished, well-developed patient in no acute distress Psych:  Mood and affect appropriate for situation CV: Regular rate and rhythm on monitor Respiratory:  Regular, unlabored respirations on room air GI: Abdomen soft and nontender  NEURO:  Mental Status: Awake alert and oriented to self, month, age.  She is not oriented completely to situation. Speech/Language: speech is without dysarthria or aphasia.  Naming, repetition, fluency, and comprehension intact.  Cranial Nerves:  II: PERRL.  Right eye right peripheral vision impairment reported to be patient's baseline. III, IV, VI: EOMI.  Eyelids elevate symmetrically.  V: Sensation is intact to light touch and symmetrical to face.  VII: Face is symmetrical resting and with  movement VIII: Hearing is intact to voice. IX, X: Palate elevates symmetrically. Phonation intact XI: Shoulder shrug 5/5. XII: Tongue protrudes midline Motor: Sustained antigravity movement in upper and lower extremities without vertical drift.  Diminished fine finger movements on the left RIGHT over left upper extremity.  Trace weakness of left grip. Tone: is normal and bulk is normal Sensation: Intact to light touch bilaterally. Extinction absent to light touch to DSS.   Coordination: FTN intact bilaterally, HKS: no ataxia in BLE.No drift.  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  Code Stroke CT head No acute abnormality.  CTA head & neck: Proximal right M2 MCA occlusion.  Repeat CTH: PENDING MRI   Small / subtle acute infarcts right posterior MCA/PCA watershed, without hemorrhagic transformation or mass effect. Left posterior convexity scalp soft tissue injury. Chronic small vessel disease, including in the bilateral cerebellum. 2D Echo PENDING LDL No results found for requested labs within last 1095 days. HgbA1c No results found for requested labs within last 1095 days. Will need 30 day event monitor at discharge VTE prophylaxis - SCDs aspirin 81 mg daily prior to admission, now on No antithrombotic pending 24hr post-TNK imaging.  Therapy recommendations:  Pending Disposition: Pending  Hypertension Home meds:  amlodipine 10mg  Stable Blood Pressure Goal: BP less than 180/105   Hyperlipidemia Home meds:  Lipitor 20mg , resumed in hospital LDL No results found for requested labs within last 1095 days., goal < 70 Continue statin at discharge  Diabetes type II Controlled with hyperglycemia Home meds:  Humulin HgbA1c No results found for requested labs within last 1095 days., goal <  7.0 CBGs SSI Recommend close follow-up with PCP for better DM control  Other Stroke Risk Factors Advanced Age  Other Active Problems Acute kidney injury vs CKD 3a Cr 1.14, GFR 45, BUN 29 Avoid nephrotoxic agents  Hospital day # 1  Stevi Toberman, AGACNP-BC [facility] Pager: [phone] I have personally obtained history,examined this patient, reviewed notes, independently viewed imaging studies, participated in medical decision making and plan of care.ROS completed by me personally and pertinent positives fully documented  I have made any additions or clarifications directly to the above note. Agree with note above.  Patient presented with sudden onset left hemiplegia, neglect and facial droop with NIH of 9 due to right M2 occlusion and underwent successful mechanical thrombectomy with excellent revascularization.  Patient has done quite well with practically no residual deficits.  MRI scan shows only a small right internal capsule infarct.  Continue close neurological monitoring and strict blood pressure control as per post TNK protocol.  Mobilize out of bed.  Therapy consult.  Speech therapy swallow eval.  Likely need dual antiplatelet therapy for 3 weeks followed by Plavix alone.  She will need 30-day heart monitor for paroxysmal A-fib.  Long discussion with patient and granddaughter at the bedside and answered questions. This patient is critically ill and at significant risk of neurological worsening, death and care requires constant monitoring of vital signs, hemodynamics,respiratory and cardiac monitoring, extensive review of multiple databases, frequent neurological assessment, discussion with family, other specialists and medical decision making of high complexity.I have made any additions or clarifications directly to the above note.This critical care time does not  reflect procedure time, or teaching time or supervisory time of PA/NP/Med Resident etc but could involve care  discussion time.  I spent 30 minutes of neurocritical care time  in the care of  this patient.     Eather Popp, MD Medical Director [facility] Pager: [phone] 04/27/2024 4:01 PM  To contact Stroke Continuity provider, please refer to [email or web address]. After hours, contact General Neurology

## 2024-04-27 NOTE — Progress Notes (Signed)
  Echocardiogram 2D Echocardiogram has been performed.  Tinnie FORBES Gosling RDCS 04/27/2024, 10:27 AM

## 2024-04-27 NOTE — Progress Notes (Signed)
 30d monitor ordered for stroke. Dr. Floretta to read

## 2024-04-27 NOTE — Evaluation (Signed)
 Speech Language Pathology Evaluation Patient Details Name: Joann Anderson MRN: [id number] DOB: 10-10-1929 Today's Date: 04/27/2024 Time: 1046-1100 SLP Time Calculation (min) (ACUTE ONLY): 14 min  Problem List:  Patient Active Problem List   Diagnosis Date Noted   Stroke (cerebrum) (HCC) 04/26/2024   Ptosis of eyelid 07/02/2013   Cataract, nuclear 05/15/2012   Primary open angle glaucoma of both eyes, severe stage 05/15/2012   Past Medical History:  Past Medical History:  Diagnosis Date   Cancer (HCC)    Diabetes mellitus without complication (HCC)    Hyperlipemia    Past Surgical History:  Past Surgical History:  Procedure Laterality Date   ABDOMINAL HYSTERECTOMY     BREAST SURGERY     RADIOLOGY WITH ANESTHESIA N/A 04/26/2024   Procedure: RADIOLOGY WITH ANESTHESIA;  Surgeon: Radiologist, Medication, MD;  Location: [facility] OR;  Service: Radiology;  Laterality: N/A;   HPI:  89 yo female presenting 10/23 with L sided facial droop and neglect. CTA shows R M2 occlusion s/p thrombectomy. MRI with small acute infarcts in the R posterior MCA/PCA watershed. PMH: DM, hypercholesterolemia, CKD 3A, essential HTN, bilateral   Assessment / Plan / Recommendation Clinical Impression  Pt and her spouse live at home independently. Per chart review, pt has history of severe bilateral glaucoma, which impacted her ability to complete portions of the evaluation. She scored WFL on all subsections of the Cognistat excluding delayed recall, calculations, and reasoning. She was otherwise able to follow multistep commands, demonstrating intact attention, auditory comprehension, and expressive language. Pt states that memory and math are areas of difficulty at baseline but reasoning seemed exceptionally challenging. SLP will f/u to target functional reasoning tasks given level of independence PTA.    SLP Assessment  SLP Recommendation/Assessment: Patient needs continued Speech Language Pathology Services SLP  Visit Diagnosis: Cognitive communication deficit (R41.841)     Assistance Recommended at Discharge  Intermittent Supervision/Assistance  Functional Status Assessment Patient has had a recent decline in their functional status and demonstrates the ability to make significant improvements in function in a reasonable and predictable amount of time.  Frequency and Duration min 2x/week  1 week      SLP Evaluation Cognition  Overall Cognitive Status: No family/caregiver present to determine baseline cognitive functioning Arousal/Alertness: Awake/alert Orientation Level: Oriented X4 Attention: Sustained Sustained Attention: Appears intact Memory: Impaired Memory Impairment: Retrieval deficit Awareness: Appears intact Problem Solving: Impaired Problem Solving Impairment: Verbal basic Executive Function: Reasoning Reasoning: Impaired Reasoning Impairment: Verbal basic       Comprehension  Auditory Comprehension Overall Auditory Comprehension: Appears within functional limits for tasks assessed    Expression Expression Primary Mode of Expression: Verbal Verbal Expression Overall Verbal Expression: Appears within functional limits for tasks assessed   Oral / Motor  Oral Motor/Sensory Function Overall Oral Motor/Sensory Function: Within functional limits Motor Speech Overall Motor Speech: Appears within functional limits for tasks assessed            Damien Blumenthal, M.A., CCC-SLP Speech Language Pathology, Acute Rehabilitation Services  Secure Chat preferred [phone]  04/27/2024, 12:19 PM

## 2024-04-27 NOTE — Anesthesia Postprocedure Evaluation (Signed)
 Anesthesia Post Note  Patient: Joann Anderson  Procedure(s) Performed: RADIOLOGY WITH ANESTHESIA     Patient location during evaluation: PACU Anesthesia Type: General Level of consciousness: awake and alert Pain management: pain level controlled Vital Signs Assessment: post-procedure vital signs reviewed and stable Respiratory status: spontaneous breathing, nonlabored ventilation, respiratory function stable and patient connected to nasal cannula oxygen Cardiovascular status: blood pressure returned to baseline and stable Postop Assessment: no apparent nausea or vomiting Anesthetic complications: no   No notable events documented.  Last Vitals:  Vitals:   04/27/24 0015 04/27/24 0100  BP: (!) 148/65   Pulse: 64   Resp: (!) 8   Temp: 36.6 C (!) 36.4 C  SpO2: 93%     Last Pain:  Vitals:   04/27/24 0100  TempSrc: Oral  PainSc:                  [name]

## 2024-04-27 NOTE — Progress Notes (Signed)
 OT Cancellation Note  Patient Details Name: Joann Anderson MRN: [id number] DOB: 03-Dec-1929   Cancelled Treatment:    Reason Eval/Treat Not Completed: Active bedrest order   Ely Molt 04/27/2024, 8:24 AM

## 2024-04-27 NOTE — TOC CAGE-AID Note (Signed)
 Transition of Care [facility]) - CAGE-AID Screening   Patient Details  Name: Joann Anderson MRN: [id number] Date of Birth: December 06, 1929  [facility] CM/SW Contact:    Herschel Fleagle E Aristotle Lieb, LCSW Phone Number: 04/27/2024, 9:05 AM   Clinical Narrative: No SA noted.   CAGE-AID Screening:    Have You Ever Felt You Ought to Cut Down on Your Drinking or Drug Use?: No Have People Annoyed You By Critizing Your Drinking Or Drug Use?: No Have You Felt Bad Or Guilty About Your Drinking Or Drug Use?: No Have You Ever Had a Drink or Used Drugs First Thing In The Morning to Steady Your Nerves or to Get Rid of a Hangover?: No CAGE-AID Score: 0  Substance Abuse Education Offered: No

## 2024-04-27 NOTE — Progress Notes (Signed)
 Report given to 4N RN Dorn.  All questions answered.

## 2024-04-27 NOTE — Evaluation (Signed)
 Physical Therapy Evaluation Patient Details Name: Joann Anderson MRN: [id number] DOB: 1930-05-17 Today's Date: 04/27/2024  History of Present Illness  89 yo female presents with L facial droop L side neglect s/p fall. 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 Pt to benefit from acute PT to address deficits. Pt ambulated short hallway distance with use of RW, overall pt requiring close guard to occasional light assist to steady. At baseline pt is independent, wants to d/c home with support of spouse who pt states can assist her as needed. PT to progress mobility as tolerated, and will continue to follow acutely.          If plan is discharge home, recommend the following: A little help with walking and/or transfers;A little help with bathing/dressing/bathroom   Can travel by private vehicle        Equipment Recommendations None recommended by PT  Recommendations for Other Services       Functional Status Assessment Patient has had a recent decline in their functional status and demonstrates the ability to make significant improvements in function in a reasonable and predictable amount of time.     Precautions / Restrictions Precautions Precautions: Fall Precaution/Restrictions Comments: R eye blindness Restrictions Weight Bearing Restrictions Per Provider Order: No      Mobility  Bed Mobility Overal bed mobility: Needs Assistance Bed Mobility: Supine to Sit     Supine to sit: Supervision, HOB elevated, Used rails     General bed mobility comments: increased time, use of bedrails and HOB elevation. no physical assist    Transfers Overall transfer level: Needs assistance Equipment used: Rolling walker (2 wheels) Transfers: Sit to/from Stand Sit to Stand: Min assist, From elevated  surface           General transfer comment: light rise and steady assist, first attempt pt with difficulty rising and abruptly sat back down. Second stand improved with elevated bed height    Ambulation/Gait Ambulation/Gait assistance: Contact guard assist, Min assist Gait Distance (Feet): 50 Feet Assistive device: Rolling walker (2 wheels) Gait Pattern/deviations: Step-through pattern, Decreased stride length, Trunk flexed, Drifts right/left Gait velocity: decr     General Gait Details: close guard for safety, occasional steadying and RW assist suspect mostly due to visual impairment that is baseline  Careers information officer     Tilt Bed    Modified Rankin (Stroke Patients Only) Modified Rankin (Stroke Patients Only) Pre-Morbid Rankin Score: Slight disability Modified Rankin: Moderately severe disability     Balance Overall balance assessment: Needs assistance Sitting-balance support: No upper extremity supported, Feet supported Sitting balance-Leahy Scale: Fair     Standing balance support: Bilateral upper extremity supported, During functional activity Standing balance-Leahy Scale: Fair Standing balance comment: can stand statically without AD, requires use of RW dynamically presently                             Pertinent Vitals/Pain Pain Assessment Pain Assessment: No/denies pain    Home Living Family/patient expects to be discharged to:: Private residence Living Arrangements: Spouse/significant other Available Help at Discharge: Family Type of Home: House Home Access: Level entry       Home Layout: One level Home Equipment: Rollator (4 wheels)  Prior Function Prior Level of Function : Independent/Modified Independent             Mobility Comments: uses rollator every once in a while , endorses no falls, doesn't drive because of eyesight ADLs Comments: indep     Extremity/Trunk Assessment   Upper  Extremity Assessment Upper Extremity Assessment: Defer to OT evaluation    Lower Extremity Assessment Lower Extremity Assessment: Generalized weakness (4/5 throughout, bilat, with mild + incoordination noted with + rebound knee ext/flex bilat)    Cervical / Trunk Assessment Cervical / Trunk Assessment: Kyphotic  Communication   Communication Communication: No apparent difficulties    Cognition Arousal: Alert Behavior During Therapy: WFL for tasks assessed/performed   PT - Cognitive impairments: No family/caregiver present to determine baseline                       PT - Cognition Comments: some decreased awareness of spatial orientation in hallway, difficulty with multistep commands Following commands: Impaired Following commands impaired: Follows one step commands with increased time     Cueing Cueing Techniques: Verbal cues, Gestural cues     General Comments General comments (skin integrity, edema, etc.): finger-to-nose dysmetric bilat but suspect due to visual impairment    Exercises     Assessment/Plan    PT Assessment Patient needs continued PT services  PT Problem List Decreased strength;Decreased mobility;Decreased activity tolerance;Decreased balance;Decreased knowledge of use of DME;Pain;Decreased safety awareness;Decreased coordination       PT Treatment Interventions DME instruction;Therapeutic activities;Gait training;Patient/family education;Therapeutic exercise;Balance training;Stair training;Functional mobility training;Neuromuscular re-education    PT Goals (Current goals can be found in the Care Plan section)  Acute Rehab PT Goals Patient Stated Goal: home PT Goal Formulation: With patient Time For Goal Achievement: 05/11/24 Potential to Achieve Goals: Good    Frequency Min 2X/week     Co-evaluation               AM-PAC PT 6 Clicks Mobility  Outcome Measure Help needed turning from your back to your side while in a flat bed  without using bedrails?: A Little Help needed moving from lying on your back to sitting on the side of a flat bed without using bedrails?: A Little Help needed moving to and from a bed to a chair (including a wheelchair)?: A Little Help needed standing up from a chair using your arms (e.g., wheelchair or bedside chair)?: A Little Help needed to walk in hospital room?: A Little Help needed climbing 3-5 steps with a railing? : A Little 6 Click Score: 18    End of Session Equipment Utilized During Treatment: Gait belt Activity Tolerance: Patient tolerated treatment well;Patient limited by fatigue Patient left: in chair;with chair alarm set;with call bell/phone within reach Nurse Communication: Mobility status PT Visit Diagnosis: Other abnormalities of gait and mobility (R26.89);Muscle weakness (generalized) (M62.81)    Time: 8478-8459 PT Time Calculation (min) (ACUTE ONLY): 19 min   Charges:   PT Evaluation $PT Eval Low Complexity: 1 Low   PT General Charges $$ ACUTE PT VISIT: 1 Visit         Johana RAMAN, PT DPT Acute Rehabilitation Services Secure Chat Preferred  Office [phone]   [place] Le[place] E Stroup 04/27/2024, 3:09 PM

## 2024-04-27 NOTE — Progress Notes (Signed)
 Spoke with Dr. Michaela in person, and reconfirmed later. Dr. Michaela said he would like the MRI done before the post 24 hour TNK mark (preferably this shift), and would do a CT at the post 24 hour TNK mark.

## 2024-04-27 NOTE — Evaluation (Signed)
 Occupational Therapy Evaluation Patient Details Name: Joann Anderson MRN: [id number] DOB: [birth year] Today's Date: 04/27/2024   History of Present Illness   89 yo female presents with L facial droop L side neglect s/p fall. 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 Pt currently with functional limitiations due to the deficits listed below (see OT problem list). Pt at baseline has spouse present in the home for things like medication management due to low vision. Pt could benefit from [facility] and low vision training. Pt currently undershooting adl task. Pt will do better in a familiar environment.Pt will benefit from skilled OT to increase their independence and safety with adls and balance to allow discharge hHOT.      If plan is discharge home, recommend the following:   A little help with bathing/dressing/bathroom;Assist for transportation;Direct supervision/assist for medications management     Functional Status Assessment   Patient has had a recent decline in their functional status and demonstrates the ability to make significant improvements in function in a reasonable and predictable amount of time.     Equipment Recommendations   None recommended by OT     Recommendations for Other Services   Other (comment) (low vision services)     Precautions/Restrictions   Precautions Precautions: Fall Precaution/Restrictions Comments: R eye blindness Restrictions Weight Bearing Restrictions Per Provider Order: No     Mobility Bed Mobility Overal bed mobility: Needs Assistance Bed Mobility: Supine to Sit     Supine to sit: Supervision, HOB elevated, Used rails     General bed mobility comments: increased time, use of bedrails and HOB elevation. no physical assist    Transfers Overall transfer level: Needs assistance Equipment used: Rolling walker (2  wheels) Transfers: Sit to/from Stand Sit to Stand: Contact guard assist                  Balance Overall balance assessment: Needs assistance Sitting-balance support: No upper extremity supported, Feet supported Sitting balance-Leahy Scale: Fair     Standing balance support: Bilateral upper extremity supported, During functional activity Standing balance-Leahy Scale: Fair Standing balance comment: can stand statically without AD, requires use of RW dynamically presently                           ADL either performed or assessed with clinical judgement   ADL Overall ADL's : Needs assistance/impaired Eating/Feeding: Set up   Grooming: Standing;Contact guard assist Grooming Details (indicate cue type and reason): under shooting at soap and faucet                 Toilet Transfer: Contact guard assist;Ambulation;Regular Toilet;Rolling walker (2 wheels);Grab bars   Toileting- Clothing Manipulation and Hygiene: Contact guard assist;Sit to/from stand       Functional mobility during ADLs: Contact guard assist;Rolling walker (2 wheels) General ADL Comments: pt bumping door frames with RW. pt needed cues for distance throughout session     Vision Baseline Vision/History: 2 Legally blind Ability to See in Adequate Light: 2 Moderately impaired Additional Comments: pt with undershooting consistently during session. pt likely to have more accuracy in a familiar environment.     Perception         Praxis         Pertinent Vitals/Pain Pain Assessment Pain Assessment: No/denies pain     Extremity/Trunk Assessment Upper Extremity Assessment  Upper Extremity Assessment: Overall WFL for tasks assessed   Lower Extremity Assessment Lower Extremity Assessment: Defer to PT evaluation   Cervical / Trunk Assessment Cervical / Trunk Assessment: Kyphotic   Communication Communication Communication: No apparent difficulties   Cognition Arousal:  Alert Behavior During Therapy: WFL for tasks assessed/performed Cognition: No apparent impairments                               Following commands: Intact       Cueing  General Comments   Cueing Techniques: Tactile cues;Verbal cues  could benefit from LOW VISION community support. advised family to remove throw rugs in bathroom. spouse reports he will do that today   Exercises     Shoulder Instructions      Home Living Family/patient expects to be discharged to:: Private residence Living Arrangements: Spouse/significant other Available Help at Discharge: Family Type of Home: House Home Access: Level entry     Home Layout: One level     Bathroom Shower/Tub: Chief Strategy Officer: Standard     Home Equipment: Rollator (4 wheels)   Additional Comments: spouse reports helping with medications as needed. spouse drives. niece in room to help the couple currently  Lives With: Spouse    Prior Functioning/Environment Prior Level of Function : Independent/Modified Independent             Mobility Comments: uses rollator every once in a while , endorses no falls, doesn't drive because of eyesight ADLs Comments: indep    OT Problem List: Decreased activity tolerance;Impaired vision/perception   OT Treatment/Interventions: Self-care/ADL training;Therapeutic activities;Visual/perceptual remediation/compensation;Patient/family education      OT Goals(Current goals can be found in the care plan section)   Acute Rehab OT Goals Patient Stated Goal: to go home OT Goal Formulation: With patient/family Time For Goal Achievement: 05/11/24 Potential to Achieve Goals: Good   OT Frequency:  Min 2X/week    Co-evaluation              AM-PAC OT 6 Clicks Daily Activity     Outcome Measure Help from another person eating meals?: A Little Help from another person taking care of personal grooming?: A Little Help from another person  toileting, which includes using toliet, bedpan, or urinal?: A Little Help from another person bathing (including washing, rinsing, drying)?: A Little Help from another person to put on and taking off regular upper body clothing?: A Little Help from another person to put on and taking off regular lower body clothing?: A Little 6 Click Score: 18   End of Session Equipment Utilized During Treatment: Rolling walker (2 wheels) Nurse Communication: Mobility status;Precautions  Activity Tolerance: Patient tolerated treatment well Patient left: in bed;with call bell/phone within reach;with bed alarm set;with family/visitor present  OT Visit Diagnosis: Unsteadiness on feet (R26.81)                Time: 8548-8485 OT Time Calculation (min): 23 min Charges:  OT General Charges $OT Visit: 1 Visit OT Evaluation $OT Eval Moderate Complexity: 1 Mod   Brynn, OTR/L  Acute Rehabilitation Services Office: [phone] .   Ely Molt 04/27/2024, 4:25 PM

## 2024-04-28 ENCOUNTER — Other Ambulatory Visit (HOSPITAL_COMMUNITY): Payer: Self-pay

## 2024-04-28 DIAGNOSIS — I63411 Cerebral infarction due to embolism of right middle cerebral artery: Secondary | ICD-10-CM | POA: Diagnosis not present

## 2024-04-28 DIAGNOSIS — R29700 NIHSS score 0: Secondary | ICD-10-CM

## 2024-04-28 LAB — CBC
HCT: 29.9 % — ABNORMAL LOW (ref 36.0–46.0)
Hemoglobin: 10 g/dL — ABNORMAL LOW (ref 12.0–15.0)
MCH: 32.1 pg (ref 26.0–34.0)
MCHC: 33.4 g/dL (ref 30.0–36.0)
MCV: 95.8 fL (ref 80.0–100.0)
Platelets: 207 K/uL (ref 150–400)
RBC: 3.12 MIL/uL — ABNORMAL LOW (ref 3.87–5.11)
RDW: 11.8 % (ref 11.5–15.5)
WBC: 9.9 K/uL (ref 4.0–10.5)
nRBC: 0 % (ref 0.0–0.2)

## 2024-04-28 LAB — GLUCOSE, CAPILLARY
Glucose-Capillary: 85 mg/dL (ref 70–99)
Glucose-Capillary: 145 mg/dL — ABNORMAL HIGH (ref 70–99)
Glucose-Capillary: 193 mg/dL — ABNORMAL HIGH (ref 70–99)
Glucose-Capillary: 202 mg/dL — ABNORMAL HIGH (ref 70–99)

## 2024-04-28 LAB — BASIC METABOLIC PANEL WITH GFR
Anion gap: 10 (ref 5–15)
BUN: 21 mg/dL (ref 8–23)
CO2: 22 mmol/L (ref 22–32)
Calcium: 9.3 mg/dL (ref 8.9–10.3)
Chloride: 106 mmol/L (ref 98–111)
Creatinine, Ser: 0.97 mg/dL (ref 0.44–1.00)
GFR, Estimated: 55 mL/min — ABNORMAL LOW (ref >60)
Glucose, Bld: 127 mg/dL — ABNORMAL HIGH (ref 70–99)
Potassium: 3.9 mmol/L (ref 3.5–5.1)
Sodium: 138 mmol/L (ref 135–145)

## 2024-04-28 LAB — VITAMIN B12: Vitamin B-12: 1183 pg/mL — ABNORMAL HIGH (ref 180–914)

## 2024-04-28 LAB — FOLATE: Folate: >20 ng/mL (ref >5.9)

## 2024-04-28 MED ORDER — CLOPIDOGREL BISULFATE 75 MG PO TABS
75.0000 mg | ORAL_TABLET | Freq: Every day | ORAL | Status: DC
  Administered 2024-04-28: 75 mg via ORAL
  Filled 2024-04-28: qty 1

## 2024-04-28 MED ORDER — VALSARTAN-HYDROCHLOROTHIAZIDE 320-25 MG PO TABS: 1.0000 | ORAL_TABLET | Freq: Every day | ORAL | Status: DC

## 2024-04-28 MED ORDER — HYDROCHLOROTHIAZIDE 25 MG PO TABS
25.0000 mg | ORAL_TABLET | Freq: Every day | ORAL | Status: DC
  Administered 2024-04-28: 25 mg via ORAL
  Filled 2024-04-28: qty 1

## 2024-04-28 MED ORDER — IRBESARTAN 150 MG PO TABS
300.0000 mg | ORAL_TABLET | Freq: Every day | ORAL | Status: DC
  Administered 2024-04-28: 300 mg via ORAL

## 2024-04-28 MED ORDER — ASPIRIN 81 MG PO TBEC
81.0000 mg | DELAYED_RELEASE_TABLET | Freq: Every day | ORAL | Status: DC
  Administered 2024-04-28: 81 mg via ORAL
  Filled 2024-04-28: qty 1

## 2024-04-28 MED ORDER — CLOPIDOGREL BISULFATE 75 MG PO TABS
75.0000 mg | ORAL_TABLET | Freq: Every day | ORAL | 1 refills | Status: AC
  Filled 2024-04-28 – 2024-05-23 (×4): qty 30, 30d supply, fill #0

## 2024-04-28 MED ORDER — ASPIRIN 81 MG PO TBEC
81.0000 mg | DELAYED_RELEASE_TABLET | Freq: Every day | ORAL | 0 refills | Status: DC
  Filled 2024-04-28: qty 21, 21d supply, fill #0

## 2024-04-28 MED ORDER — AMLODIPINE BESYLATE 10 MG PO TABS
10.0000 mg | ORAL_TABLET | Freq: Every day | ORAL | Status: DC
  Administered 2024-04-28: 10 mg via ORAL
  Filled 2024-04-28: qty 1

## 2024-04-28 NOTE — TOC Transition Note (Signed)
 Transition of Care [facility]) - Discharge Note   Patient Details  Name: Romelia Bromell MRN: [id number] Date of Birth: 05-05-1930  [facility] CM/SW Contact:  Robynn Eileen Hoose, RN Phone Number: 04/28/2024, 10:59 AM   Clinical Narrative:   Secure message from provider regarding patient needing HH services and rolling walker. Amy with [facility] able to accept pt for [facility] services. Rolling walker ordered through Jermaine with [employer] to be delivered to bedside.    Final next level of care: Home w Home Health Services Barriers to Discharge: No Barriers Identified   Patient Goals and CMS Choice            Discharge Placement                       Discharge Plan and Services Additional resources added to the After Visit Summary for                  DME Arranged: Walker rolling DME Agency: [employer] Date DME Agency Contacted: 04/28/24 Time DME Agency Contacted: 1051 Representative spoke with at DME Agency: [place] HH Arranged: PT, OT HH Agency: [facility] Date [facility] Agency Contacted: 04/28/24 Time HH Agency Contacted: 1053 Representative spoke with at [facility] Agency: Amy  Social Drivers of Health (SDOH) Interventions SDOH Screenings   Tobacco Use: Low Risk  (04/26/2024)     Readmission Risk Interventions     No data to display

## 2024-04-28 NOTE — Discharge Summary (Addendum)
 Stroke Discharge Summary  Patient ID: Joann Anderson   MRN: [id number]      DOB: 1929/12/31  Date of Admission: 04/26/2024 Date of Discharge: 04/28/2024  Attending Physician: Jerri Pfeiffer MD Consultant(s):    neurology  Patient's PCP:  Catalina Bare, MD  DISCHARGE PRIMARY DIAGNOSIS:  Stroke:  right MCA small scattered infarcts with R M2 occlusion s/p TNK and IR with TICI3 revascularization, etiology concerning for cardioembolic source.   Secondary diagnosis: HTN HLD DM CKD 3a R eye blind   Allergies as of 04/28/2024       Reactions   Aspirin Other (See Comments)   Causes bleeding   Quinine Rash   Sulfamethoxazole Rash        Medication List     STOP taking these medications    aspirin 81 MG tablet Replaced by: aspirin EC 81 MG tablet       TAKE these medications    amLODipine 10 MG tablet Commonly known as: NORVASC Take 10 mg by mouth daily.   aspirin EC 81 MG tablet Take 1 tablet (81 mg total) by mouth daily. Swallow whole. Start taking on: April 29, 2024 Replaces: aspirin 81 MG tablet   atorvastatin 20 MG tablet Commonly known as: LIPITOR Take 20 mg by mouth daily.   clopidogrel 75 MG tablet Commonly known as: PLAVIX Take 1 tablet (75 mg total) by mouth daily. Start taking on: April 29, 2024   dorzolamide-timolol 2-0.5 % ophthalmic solution Commonly known as: COSOPT Place 1 drop into the right eye 2 (two) times daily.   HumuLIN 70/30 (70-30) 100 UNIT/ML injection Generic drug: insulin NPH-regular Human Inject 8-15 Units into the skin See admin instructions. 15qam 8qpm   moxifloxacin 0.5 % ophthalmic solution Commonly known as: VIGAMOX Place 1 drop into the left eye 4 (four) times daily.   MULTIVITAMIN PO Take 1 tablet by mouth daily.   valsartan-hydrochlorothiazide 320-25 MG tablet Commonly known as: DIOVAN-HCT Take 1 tablet by mouth daily.               Durable Medical Equipment  (From admission, onward)            Start     Ordered   04/28/24 1045  For home use only DME Walker rolling  Once       Question Answer Comment  Walker: With 5 Inch Wheels   Patient needs a walker to treat with the following condition Stroke (HCC)      04/28/24 1045            LABORATORY STUDIES CBC    Component Value Date/Time   WBC 9.9 04/28/2024 0617   RBC 3.12 (L) 04/28/2024 0617   HGB 10.0 (L) 04/28/2024 0617   HGB 12.1 04/01/2006 0812   HCT 29.9 (L) 04/28/2024 0617   HCT 35.4 04/01/2006 0812   PLT 207 04/28/2024 0617   PLT 209 04/01/2006 0812   MCV 95.8 04/28/2024 0617   MCV 97.7 04/01/2006 0812   MCH 32.1 04/28/2024 0617   MCHC 33.4 04/28/2024 0617   RDW 11.8 04/28/2024 0617   RDW 12.8 04/01/2006 0812   LYMPHSABS 1.6 04/26/2024 2133   LYMPHSABS 1.6 04/01/2006 0812   MONOABS 0.9 04/26/2024 2133   MONOABS 0.5 04/01/2006 0812   EOSABS 0.5 04/26/2024 2133   EOSABS 0.2 04/01/2006 0812   BASOSABS 0.0 04/26/2024 2133   BASOSABS 0.0 04/01/2006 0812   CMP    Component Value Date/Time   NA 138 04/28/2024 0617  K 3.9 04/28/2024 0617   CL 106 04/28/2024 0617   CO2 22 04/28/2024 0617   GLUCOSE 127 (H) 04/28/2024 0617   BUN 21 04/28/2024 0617   CREATININE 0.97 04/28/2024 0617   CALCIUM  9.3 04/28/2024 0617   PROT 7.1 04/26/2024 2133   ALBUMIN 3.5 04/26/2024 2133   AST 34 04/26/2024 2133   ALT 26 04/26/2024 2133   ALKPHOS 74 04/26/2024 2133   BILITOT 0.7 04/26/2024 2133   GFRNONAA 55 (L) 04/28/2024 0617   COAGS Lab Results  Component Value Date   INR 1.1 04/26/2024   Lipid Panel    Component Value Date/Time   CHOL 120 04/27/2024 1414   TRIG 51 04/27/2024 1414   HDL 47 04/27/2024 1414   CHOLHDL 2.6 04/27/2024 1414   VLDL 10 04/27/2024 1414   LDLCALC 63 04/27/2024 1414   HgbA1C  Lab Results  Component Value Date   HGBA1C 5.8 (H) 04/27/2024   Alcohol Level    Component Value Date/Time   [facility] <15 04/26/2024 2133     SIGNIFICANT DIAGNOSTIC STUDIES CT HEAD WO  CONTRAST Result Date: 04/27/2024 EXAM: CT HEAD WITHOUT CONTRAST 04/27/2024 09:38:35 PM TECHNIQUE: CT of the head was performed without the administration of intravenous contrast. Automated exposure control, iterative reconstruction, and/or weight based adjustment of the mA/kV was utilized to reduce the radiation dose to as low as reasonably achievable. COMPARISON: Same day MRI. CT Head Apr 26, 2024 CLINICAL HISTORY: Stroke, follow up. FINDINGS: BRAIN AND VENTRICLES: Known infarcts are better characterized on same-day MRI. No progressive mass effect or acute hemorrhage. No hydrocephalus. No extra-axial collection. No mass effect or midline shift. ORBITS: No acute abnormality. SINUSES: No acute abnormality. SOFT TISSUES AND SKULL: No acute soft tissue abnormality. No skull fracture. IMPRESSION: 1. Known infarcts are better characterized on same-day MRI. No progressive mass effect or acute hemorrhage. Electronically signed by: Gilmore Molt MD 04/27/2024 10:41 PM EDT RP Workstation: [id number]   ECHOCARDIOGRAM COMPLETE Result Date: 04/27/2024    ECHOCARDIOGRAM REPORT   Patient Name:   Joann Anderson Date of Exam: 04/27/2024 Medical Rec #:  [id number]     Height:       70.0 in Accession #:    [id number]    Weight:       172.4 lb Date of Birth:  [birth year]    BSA:          1.960 m Patient Age:    89 years      BP:           148/83 mmHg Patient Gender: F             HR:           84 bpm. Exam Location:  Inpatient Procedure: 2D Echo, Cardiac Doppler and Color Doppler (Both Spectral and Color            Flow Doppler were utilized during procedure). Indications:    Stroke 434.91 / I163.9  History:        Patient has no prior history of Echocardiogram examinations.  Sonographer:    Tinnie Gosling RDCS Referring Phys: [phone] MCNEILL P KIRKPATRICK  Sonographer Comments: Technically difficult study due to poor echo windows and suboptimal apical window. IMPRESSIONS  1. Left ventricular ejection fraction, by estimation, is 60  to 65%. The left ventricle has normal function. The left ventricle has no regional wall motion abnormalities. There is mild concentric left ventricular hypertrophy. Left ventricular diastolic parameters are indeterminate.  2. Right ventricular systolic function is  normal. The right ventricular size is normal.  3. The mitral valve is degenerative. Trivial mitral valve regurgitation. No evidence of mitral stenosis. Moderate mitral annular calcification.  4. The aortic valve is tricuspid. There is mild calcification of the aortic valve. Aortic valve regurgitation is not visualized. No aortic stenosis is present.  5. Aortic dilatation noted. There is borderline dilatation of the aortic root, measuring 38 mm. Conclusion(s)/Recommendation(s): Technically very limited study due to poor sound wave transmission. Patient refused Definity contrast. FINDINGS  Left Ventricle: Left ventricular ejection fraction, by estimation, is 60 to 65%. The left ventricle has normal function. The left ventricle has no regional wall motion abnormalities. The left ventricular internal cavity size was normal in size. There is  mild concentric left ventricular hypertrophy. Left ventricular diastolic parameters are indeterminate. Right Ventricle: The right ventricular size is normal. No increase in right ventricular wall thickness. Right ventricular systolic function is normal. Left Atrium: Left atrial size was normal in size. Right Atrium: Right atrial size was normal in size. Pericardium: There is no evidence of pericardial effusion. Mitral Valve: The mitral valve is degenerative in appearance. Moderate mitral annular calcification. Trivial mitral valve regurgitation. No evidence of mitral valve stenosis. Tricuspid Valve: The tricuspid valve is normal in structure. Tricuspid valve regurgitation is mild . No evidence of tricuspid stenosis. Aortic Valve: The aortic valve is tricuspid. There is mild calcification of the aortic valve. Aortic valve  regurgitation is not visualized. No aortic stenosis is present. Pulmonic Valve: The pulmonic valve was not well visualized. Pulmonic valve regurgitation is trivial. No evidence of pulmonic stenosis. Aorta: Aortic dilatation noted. There is borderline dilatation of the aortic root, measuring 38 mm. Venous: The inferior vena cava was not well visualized. IAS/Shunts: No atrial level shunt detected by color flow Doppler.  LEFT VENTRICLE PLAX 2D LVIDd:         4.50 cm   Diastology LVIDs:         2.80 cm   LV e' lateral:   9.79 cm/s LV PW:         1.20 cm   LV E/e' lateral: 7.5 LV IVS:        1.30 cm LVOT diam:     1.80 cm LV SV:         25 LV SV Index:   13 LVOT Area:     2.54 cm  LEFT ATRIUM         Index LA diam:    3.40 cm 1.73 cm/m  AORTIC VALVE LVOT Vmax:   55.00 cm/s LVOT Vmean:  37.500 cm/s LVOT VTI:    0.097 m  AORTA Ao Root diam: 3.50 cm Ao Asc diam:  3.80 cm MITRAL VALVE MV Area (PHT): 4.57 cm    SHUNTS MV Decel Time: 166 msec    Systemic VTI:  0.10 m MV E velocity: 73.30 cm/s  Systemic Diam: 1.80 cm MV A velocity: 56.90 cm/s MV E/A ratio:  1.29 Toribio Fuel MD Electronically signed by Toribio Fuel MD Signature Date/Time: 04/27/2024/8:34:07 PM    Final    MR BRAIN WO CONTRAST Result Date: 04/27/2024 EXAM: MRI BRAIN WITHOUT CONTRAST 04/27/2024 06:17:00 AM TECHNIQUE: Multiplanar multisequence MRI of the head/brain was performed without the administration of intravenous contrast. COMPARISON: Head CT and CTA dated 04/26/2024. CLINICAL HISTORY: 89 year old female with code stroke presentation, right MCA occlusion, and status post endovascular revascularization. FINDINGS: BRAIN AND VENTRICLES: Subtle cortical and subcortical white matter patchy and punctate diffusion restriction in the posterior right hemisphere,  right superior and lateral occipital lobe compatible with posterior MCA / PCA watershed ischemia. No other convincing diffusion restriction. Faint T2 and FLAIR hyperintensity associated with  the areas of abnormal diffusion no hemorrhagic transformation or mass effect. Small number of chronic microhemorrhages in the brain, most apparent in the left cerebellar hemisphere on series 12 image 18. Scattered and patchy cerebral white matter T2 and FLAIR hyperintensity. Mild for age T2 heterogeneity in the deep gray matter nuclei and brainstem. Multiple small chronic bilateral cerebellar infarcts (series 10 image 7). Nonspecific ventriculomegaly which may be ex vacuo in nature. Cavum septum lucidum, normal variant. No mass. No midline shift. The sella is unremarkable. Major vascular flow voids are preserved. ORBITS: No acute abnormality. SINUSES AND MASTOIDS: No acute abnormality. BONES AND SOFT TISSUES: Normal marrow signal. Left posterior convexity scalp hematoma or contusion series 11 image 42. IMPRESSION: 1. Small / subtle acute infarcts right posterior MCA/PCA watershed, without hemorrhagic transformation or mass effect. 2. Left posterior convexity scalp soft tissue injury. 3. Chronic small vessel disease, including in the bilateral cerebellum. Electronically signed by: Helayne Hurst MD 04/27/2024 06:32 AM EDT RP Workstation: [id number]   CT ANGIO HEAD NECK W WO CM (CODE STROKE) Result Date: 04/26/2024 EXAM: CTA HEAD AND NECK WITHOUT AND WITH 04/26/2024 09:51:48 PM TECHNIQUE: CTA of the head and neck was performed without and with the administration of intravenous contrast. Multiplanar 2D and/or 3D reformatted images are provided for review. Automated exposure control, iterative reconstruction, and/or weight based adjustment of the mA/kV was utilized to reduce the radiation dose to as low as reasonably achievable. Stenosis of the internal carotid arteries measured using NASCET criteria. COMPARISON: Same day CT head. CLINICAL HISTORY: Neuro deficit, acute, stroke suspected. Left sided weakness, right gaze; Stroke alert; FINDINGS: AORTIC ARCH AND ARCH VESSELS: No dissection or arterial injury. No  significant stenosis of the brachiocephalic or subclavian arteries. CERVICAL CAROTID ARTERIES: No dissection, arterial injury, or hemodynamically significant stenosis by NASCET criteria. CERVICAL VERTEBRAL ARTERIES: No dissection, arterial injury, or significant stenosis. LUNGS AND MEDIASTINUM: Unremarkable. SOFT TISSUES: Enalrged multinodular thyroid  further evaluated on nuclear medicine study from 03/07/2002. BONES: No acute abnormality. ANTERIOR CIRCULATION: No significant stenosis of the internal carotid arteries. No significant stenosis of the anterior cerebral arteries. Proximal right M2 MCA occlusion. No aneurysm. POSTERIOR CIRCULATION: No significant stenosis of the posterior cerebral arteries. No significant stenosis of the basilar artery. No significant stenosis of the vertebral arteries. No aneurysm. OTHER: No dural venous sinus thrombosis on this non-dedicated study. IMPRESSION: 1. Proximal right M2 MCA occlusion. Findings discussed with Dr. Michaela via telephone at 10:00 PM Electronically signed by: Gilmore Molt MD 04/26/2024 10:05 PM EDT RP Workstation: [id number]   CT HEAD CODE STROKE WO CONTRAST Result Date: 04/26/2024 EXAM: CT HEAD WITHOUT 04/26/2024 09:43:48 PM TECHNIQUE: CT of the head was performed without the administration of intravenous contrast. Automated exposure control, iterative reconstruction, and/or weight based adjustment of the mA/kV was utilized to reduce the radiation dose to as low as reasonably achievable. COMPARISON: None available. CLINICAL HISTORY: Neuro deficit, acute, stroke suspected. Left sided weakness, right gaze; Stroke alery; Neuro: kirkpatrick: [phone] FINDINGS: BRAIN AND VENTRICLES: No acute intracranial hemorrhage. No mass effect or midline shift. No extra-axial fluid collection. No evidence of acute infarct. No hydrocephalus. ORBITS: No acute abnormality. SINUSES AND MASTOIDS: No acute abnormality. SOFT TISSUES AND SKULL: No acute skull fracture. No acute  soft tissue abnormality. Findings discussed with Dr. Michaela via telephone at 9:48 PM. IMPRESSION: 1. No acute intracranial abnormality.  Electronically signed by: Gilmore Molt MD 04/26/2024 09:49 PM EDT RP Workstation: [id number]       HISTORY OF PRESENT ILLNESS 89 y.o. patient with history of HTN, DM, HLD, CKD3a presenting with acute onset of left-sided numbness, left facial droop and left-sided neglect. Received TNK and is s/p IR. MRI with small/subtle acute infarcts in the right posterior MCA/PCA watershed territor   HOSPITAL COURSE Stroke:  right MCA small scattered infarcts with R M2 occlusion s/p TNK and IR with TICI3 revascularization, etiology concerning for cardioembolic source.  Code Stroke CT head No acute abnormality.  CTA head & neck: Proximal right M2 MCA occlusion.  S/p IR with TICI 3 reperfusion MRI  Small / subtle acute infarcts right posterior MCA/PCA watershed, without hemorrhagic transformation or mass effect. 2D Echo EF 60-65% LDL 63 HgbA1c 5.8 30 day cardiac event monitor ordered VTE prophylaxis - SCDs aspirin 81 mg daily prior to admission, now on ASA 81mg  and Plavix 75mg  for 3 weeks and then plavix alone  Therapy recommendations:  HH PT Disposition: home   Hypertension Home meds:  amlodipine 10mg  Stable Long term BP goal normotensive    Hyperlipidemia Home meds:  Lipitor 20mg , resumed in hospital LDL63, goal < 70 Continue statin at discharge   Diabetes type II Controlled Home meds:  Humulin HgbA1c 5.8, goal < 7.0 CBGs with fluctuation of glucose levels SSI Recommend close follow-up with PCP for better DM control   Other Stroke Risk Factors Advanced Age   Other Active Problems R eye blind Acute kidney injury vs CKD 3a Cr 1.14, GFR 45, BUN 29 Avoid nephrotoxic agents  DISCHARGE EXAM  PHYSICAL EXAM General:  Alert, well-nourished, well-developed patient in no acute distress Psych:  Mood and affect appropriate for situation CV: Regular  rate and rhythm on monitor Respiratory:  Regular, unlabored respirations on room air GI: Abdomen soft and nontender  NEURO:  Mental Status: AA&Ox3  Speech/Language: speech is without dysarthria or aphasia.  Naming, repetition, fluency, and comprehension intact.  Cranial Nerves:  II: PERRL. Right eye blind  III, IV, VI: EOMI. Eyelids elevate symmetrically.  V: Sensation is intact to light touch and symmetrical to face.  VII: Smile is symmetrical.  VIII: hearing intact to voice. IX, X: Palate elevates symmetrically. Phonation is normal.  KP:Dynloizm shrug 5/5. XII: tongue is midline without fasciculations. Motor: 5/5 strength to all muscle groups tested.  Tone: is normal and bulk is normal Sensation- Intact to light touch bilaterally. Extinction absent to light touch to DSS. Coordination: FTN intact bilaterally, HKS: no ataxia in BLE.No drift.  Gait- deferred  1a Level of Conscious.: 0 1b LOC Questions: 0 1c LOC Commands: 0 2 Best Gaze: 0 3 Visual: 0 4 Facial Palsy: 0 5a Motor Arm - left: 0 5b Motor Arm - Right: 0 6a Motor Leg - Left: 0 6b Motor Leg - Right: 0 7 Limb Ataxia: 0 8 Sensory: 0 9 Best Language: 0 10 Dysarthria: 0 11 Extinct. and Inatten.: 0 TOTAL: 0   Discharge Diet       Diet   Diet heart healthy/carb modified Room service appropriate? Yes; Fluid consistency: Thin   liquids  DISCHARGE PLAN Disposition: Home with Home Health aspirin 81 mg daily and clopidogrel 75 mg daily for secondary stroke prevention for 3 weeks then clopidogrel 75 mg daily alone. Ongoing stroke risk factor control by Primary Care Physician at time of discharge Follow-up PCP Catalina Bare, MD in 2 weeks. Follow up with Cardiology 06/25/2024 at 10:30. Please arrive by  10:10AM Follow-up in [facility] in 8 weeks, office to schedule an appointment. Able to see NP in clinic. Follow upw ith Dr. Geneva at ophthalmology on 05/07/2024  35 minutes were  spent preparing discharge.  Patient seen and examined by NP/APP with MD. MD to update note as needed.   Jorene Last, DNP, FNP-BC [facility] Pager: [phone]  ATTENDING NOTE: I reviewed above note and agree with the assessment and plan. Pt was seen and examined.   Husband and niece are at bedside.  Patient lying bed, awake, alert, eyes open, orientated to age, place, month, initially said year was 2005 but with cues, she changed to 2025. No aphasia, paucity of speech, but following all simple commands. Able to name and repeat. No gaze palsy, tracking bilaterally, R eye blinds with possible only light perception. Left eye visual field full, PERRL. No facial droop. Tongue midline. Bilateral UEs 4/5, no drift. Bilaterally LEs 4/5, no drift. Sensation symmetrical bilaterally, b/l FTN intact, gait not tested.   Pt doing well after TNK and IR, etiology for stroke concerning for occult afib. Family declined Librexia trial. Will discharge with DAPT and continue home statin. Will do 30 day cardiac event monitoring. Follow up at [facility].   For detailed assessment and plan, please refer to above as I have made changes wherever appropriate.   Ary Cummins, MD PhD Stroke Neurology 04/28/2024 12:32 PM

## 2024-05-01 ENCOUNTER — Encounter: Payer: Self-pay | Admitting: *Deleted

## 2024-05-01 NOTE — Progress Notes (Unsigned)
 Patient enrolled for [employer] to ship a 30 day cardiac event monitor to her address on file. Letter with instructions mailed to patient. Dr. Georganna Archer to read.

## 2024-05-04 ENCOUNTER — Other Ambulatory Visit (HOSPITAL_COMMUNITY): Payer: Self-pay | Admitting: Neuroradiology

## 2024-05-04 ENCOUNTER — Encounter (HOSPITAL_COMMUNITY): Payer: Self-pay

## 2024-05-04 DIAGNOSIS — I63131 Cerebral infarction due to embolism of right carotid artery: Secondary | ICD-10-CM

## 2024-05-04 HISTORY — PX: IR US GUIDE VASC ACCESS RIGHT: IMG2390

## 2024-05-07 ENCOUNTER — Telehealth: Payer: Self-pay | Admitting: Student in an Organized Health Care Education/Training Program

## 2024-05-07 NOTE — Telephone Encounter (Signed)
  1. Is this related to a heart monitor you are wearing?  (If the patient says no, please ask     if they are caling about ICD/pacemaker.) Heart monitor  2. What is your issue?? Pt is not going to be able to wear monitor. It is too much for her. Please advise.   Please route to covering RN/CMA/RMA for results. Route to monitor technicians or your monitor tech representative for your site for any technical concerns

## 2024-05-07 NOTE — Telephone Encounter (Signed)
 Pt reports the monitor is too much for her.  She doesn't understand how to use it and does not want to wear.  Advised to mail it back to the company with the pay paid envelope included.  She states understanding.

## 2024-05-17 ENCOUNTER — Other Ambulatory Visit (HOSPITAL_COMMUNITY): Payer: Self-pay

## 2024-05-23 ENCOUNTER — Other Ambulatory Visit (HOSPITAL_COMMUNITY): Payer: Self-pay

## 2024-05-23 ENCOUNTER — Other Ambulatory Visit: Payer: Self-pay

## 2024-06-08 NOTE — Progress Notes (Signed)
 [facility] Worker Note Stroke Post Discharge Follow-Up  Ova Gillentine [id number]   Contact Type:  Telephone Encounter Date: 06/08/2024  Outreach Project:  Stroke post discharge follow-up Managed Medicaid Plan Participant:   PCP: Yes - See Care Teams in patient chart Payor Status: Does the patient have health insurance (Y/N): Yes Payor Name: : [facility] [employer] Health Worker Documentation     Row Name 06/08/24 1234     Post-Stroke CHW Follow-Up Telephone Call   Discharge Date 04/28/24   Discharge Location [facility]   How have you been feeling since being released from the hospital? Patient states she been feeling and doing really good   Program RN notified of new or worsening symptoms N/A   Little interest or pleasure in doing things Not at all   Feeling down, depressed, or hopeless (PHQ Adolescent also includes...irritable) Not at all   PHQ-2 Total Score 0   PHQ2-9 >4 Program RN notified of new or worsening symptoms. N/A     Functional Questionnaire - ADL's   Bathing Independent   Dressing Independent   Meal Prep Independent   Eating Independent   Maintaining Continence Independent   Ambulation/Transferring Independent   Medication Management Independent   Does the patient have support for these ADL's --  Patient is very dependent and does not need additional support but lives with her husband if support is needed     Post-Discharge Instructions Reviewed   Did the patient receive and understand the discharge instructions provided? Yes   Did the patient obtain their post-discharge medications? Yes   Does the patient have any questions related to side effects, education/health literacy? No     Follow-Up Appointments Review   PCP Hospital F/U appointment scheduled? Yes   Did the patient keep PCP discharge appointment? Yes   Specialist Hospital F/U appointment scheduled? Yes   Did the patient keep the specialist appointment Yes   Did the patient have  referral(s) to PT/OT/SLP post discharge? None     Risk Factor Follow-Up   Is the pateint diabetic? Yes   Does the patient have the medications/tools needed to manage their diabetes? Yes   Does the patient have hypertension? No   Does patient smoke, use tobacco product, and/or vape?  No   Does the patient have nutritional needs/restrictions? No   Does the patient exercise IF recommended by the discharge care team? No  Patient states she does not exercise but does a lot of cleaning and moving around the house   Can the patient verbalize understanding of the actions needed to manage their current condition? Yes   Can the patient verbalize actions needed to address his/her current risk factors to prevent another stroke?  Yes   Would the patient like helping finding additional community resources and/or stroke support groups? No       Past Medical History:  Diagnosis Date   Cancer (HCC)    Diabetes mellitus without complication (HCC)    Hyperlipemia    Social History   Substance and Sexual Activity  Alcohol Use No   Social History   Substance and Sexual Activity  Drug Use No   Social History   Tobacco Use  Smoking Status Never  Smokeless Tobacco Never    SDOH Screenings   Depression (PHQ2-9): Low Risk  (06/08/2024)  Tobacco Use: Low Risk  (05/07/2024)   Received from [facility]   Referrals (if applicable):        Education:  Limiting Factors:   None  Follow-up:   Initial Follow Up  Follow-up Type:   Telephone  Per health equity protocol no additional follow up needed   Renold Kozar A Mabel Roll

## 2024-06-12 NOTE — Progress Notes (Unsigned)
 [facility] [address] [place]. [place] [address] [phone]       HOSPITAL FOLLOW UP NOTE  [name] Date of Birth:  [birth year] Medical Record Number:  [id number]   Reason for Referral:  hospital stroke follow up    SUBJECTIVE:   CHIEF COMPLAINT:  No chief complaint on file.   HPI:   Joann Anderson is a 89 y.o. female with history of HTN, DM, HLD, CKD3a who presented on 04/26/2024 with acute onset of left-sided numbness, left facial droop and left-sided neglect.  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  Further workup as noted below.  Recommended 30-day cardiac event monitor outpatient to evaluate for A-fib.  LDL 63.  A1c 5.8.  Placed on DAPT for 3 weeks and Plavix  alone and continuation of atorvastatin  20 mg daily.  Therapy recommended home health therapy.        PERTINENT IMAGING  Code Stroke CT head No acute abnormality.  CTA head & neck: Proximal right M2 MCA occlusion.  S/p IR with TICI 3 reperfusion MRI  Small / subtle acute infarcts right posterior MCA/PCA watershed, without hemorrhagic transformation or mass effect. 2D Echo EF 60-65% LDL 63 HgbA1c 5.8     ROS:   14 system review of systems performed and negative with exception of ***  PMH:  Past Medical History:  Diagnosis Date   Cancer (HCC)    Diabetes mellitus without complication (HCC)    Hyperlipemia     PSH:  Past Surgical History:  Procedure Laterality Date   ABDOMINAL HYSTERECTOMY     BREAST SURGERY     IR PERCUTANEOUS ART THROMBECTOMY/INFUSION INTRACRANIAL INC DIAG ANGIO  04/26/2024   IR US  GUIDE VASC ACCESS RIGHT  05/04/2024   RADIOLOGY WITH ANESTHESIA N/A 04/26/2024   Procedure: RADIOLOGY WITH ANESTHESIA;  Surgeon: Radiologist, Medication, MD;  Location: [facility] OR;  Service: Radiology;  Laterality: N/A;    Social History:   Social History   Socioeconomic History   Marital status: Married    Spouse name: Not on file   Number of children: Not on file   Years of education: Not on file   Highest education level: Not on file  Occupational History   Not on file  Tobacco Use   Smoking status: Never   Smokeless tobacco: Never  Substance and Sexual Activity   Alcohol use: No   Drug use: No   Sexual activity: Yes    Birth control/protection: Surgical  Other Topics Concern   Not on file  Social History Narrative   Not on file   Social Drivers of Health   Financial Resource Strain: Not on file  Food Insecurity: Not on file  Transportation Needs: Not on file  Physical Activity: Not on file  Stress: Not on file  Social Connections: Not on file  Intimate Partner Violence: Not on file    Family History: No family history on file.  Medications:   Current Outpatient Medications on File Prior to Visit  Medication Sig Dispense Refill   amLODipine  (NORVASC ) 10 MG tablet Take 10 mg by mouth daily.     aspirin  EC 81 MG tablet Take 1 tablet (81 mg total) by mouth daily. Swallow whole. 21 tablet 0   atorvastatin  (LIPITOR) 20 MG tablet Take 20 mg by mouth daily.     clopidogrel  (PLAVIX ) 75 MG tablet Take 1 tablet (75 mg total)  by mouth daily. 30 tablet 1   dorzolamide -timolol  (COSOPT ) 22.3-6.8 MG/ML ophthalmic solution Place 1 drop into the right eye 2 (two) times daily.     HUMULIN 70/30 (70-30) 100 UNIT/ML injection Inject 8-15 Units into the skin See admin instructions. 15qam 8qpm     moxifloxacin (VIGAMOX) 0.5 % ophthalmic solution Place 1 drop into the left eye 4 (four) times daily.     Multiple Vitamin (MULTIVITAMIN PO) Take 1 tablet by mouth daily.     valsartan -hydrochlorothiazide  (DIOVAN -HCT) 320-25 MG tablet Take 1 tablet by mouth daily.     No current facility-administered medications on file prior to visit.    Allergies:   Allergies  Allergen Reactions   Aspirin  Other (See Comments)    Causes  bleeding   Quinine Rash   Sulfamethoxazole Rash      OBJECTIVE:  Physical Exam  There were no vitals filed for this visit. There is no height or weight on file to calculate BMI. No results found.   General: well developed, well nourished, seated, in no evident distress Head: head normocephalic and atraumatic.   Neck: supple with no carotid or supraclavicular bruits Cardiovascular: regular rate and rhythm, no murmurs Musculoskeletal: no deformity Skin:  no rash/petichiae Vascular:  Normal pulses all extremities   Neurologic Exam Mental Status: Awake and fully alert. Oriented to place and time. Recent and remote memory intact. Attention span, concentration and fund of knowledge appropriate. Mood and affect appropriate.  Cranial Nerves: Fundoscopic exam reveals sharp disc margins. Pupils equal, briskly reactive to light. Extraocular movements full without nystagmus. Visual fields full to confrontation. Hearing intact. Facial sensation intact. Face, tongue, palate moves normally and symmetrically.  Motor: Normal bulk and tone. Normal strength in all tested extremity muscles Sensory.: intact to touch , pinprick , position and vibratory sensation.  Coordination: Rapid alternating movements normal in all extremities. Finger-to-nose and heel-to-shin performed accurately bilaterally. Gait and Station: Arises from chair without difficulty. Stance is normal. Gait demonstrates normal stride length and balance with ***. Tandem walk and heel toe ***.  Reflexes: 1+ and symmetric. Toes downgoing.     NIHSS  *** Modified Rankin  ***      ASSESSMENT: Joann Anderson is a 89 y.o. 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 Vascular risk factors include HTN, HLD, DM and advanced age.      PLAN:  Right MCA scattered stroke:  Residual deficit: ***.   Continue Plavix  and atorvastatin  (Lipitor) for secondary stroke prevention managed/prescribed by PCP.   Cardiac monitor *** Discussed secondary stroke prevention measures and importance of close PCP follow up for aggressive stroke risk factor management including BP goal<130/90, HLD with LDL goal<70 and DM with A1c.<7 .  Stroke labs 04/2024: LDL 63, A1c 5.8 I have gone over the pathophysiology of stroke, warning signs and symptoms, risk factors and their management in some detail with instructions to go to the closest emergency room for symptoms of concern.     Follow up in *** or call earlier if needed   CC:  [facility] provider: Dr. Rosemarie PCP: Dwight Trula SQUIBB, MD    I personally spent a total of *** minutes in the care of the patient today including {Time Based Coding:[id number]}.    Harlene Bogaert, AGNP-BC  [facility] [address] [place], [place] [address]  Phone [phone] Fax [phone] Note: This document was  prepared with digital dictation and possible smart phrase technology. Any transcriptional errors that result from this process are unintentional.

## 2024-06-13 ENCOUNTER — Ambulatory Visit: Admitting: Adult Health

## 2024-06-13 ENCOUNTER — Encounter: Payer: Self-pay | Admitting: Adult Health

## 2024-06-13 VITALS — BP 142/60 | HR 86 | Ht 70.0 in | Wt 168.0 lb

## 2024-06-13 DIAGNOSIS — Z09 Encounter for follow-up examination after completed treatment for conditions other than malignant neoplasm: Secondary | ICD-10-CM

## 2024-06-13 DIAGNOSIS — I63411 Cerebral infarction due to embolism of right middle cerebral artery: Secondary | ICD-10-CM

## 2024-06-13 NOTE — Progress Notes (Signed)
 I agree with the above plan

## 2024-06-13 NOTE — Patient Instructions (Signed)
 Continue clopidogrel  75 mg daily  and atorvastatin  for secondary stroke prevention  Follow up with cardiology as scheduled on 12/22  Continue routine follow up with your eye doctor as scheduled   Continue to follow up with PCP regarding blood pressure and cholesterol management  Maintain strict control of hypertension with blood pressure goal below 130/90 and cholesterol with LDL cholesterol (bad cholesterol) goal below 70 mg/dL.   Signs of a Stroke? Follow the BEFAST method:  Balance Watch for a sudden loss of balance, trouble with coordination or vertigo Eyes Is there a sudden loss of vision in one or both eyes? Or double vision?  Face: Ask the person to smile. Does one side of the face droop or is it numb?  Arms: Ask the person to raise both arms. Does one arm drift downward? Is there weakness or numbness of a leg? Speech: Ask the person to repeat a simple phrase. Does the speech sound slurred/strange? Is the person confused ? Time: If you observe any of these signs, call 911.       Thank you for coming to see us  at [facility]. I hope we have been able to provide you high quality care today.  You may receive a patient satisfaction survey over the next few weeks. We would appreciate your feedback and comments so that we may continue to improve ourselves and the health of our patients.

## 2024-06-25 ENCOUNTER — Telehealth: Payer: Self-pay | Admitting: Physician Assistant

## 2024-06-25 ENCOUNTER — Ambulatory Visit: Admitting: Physician Assistant

## 2024-06-25 NOTE — Progress Notes (Unsigned)
" °  Cardiology Office Note   Date:  06/25/2024  ID:  Joann Anderson, DOB 08-27-1929, MRN [id number] PCP: Dwight Trula SQUIBB, MD  [facility] Providers Cardiologist:  None { Click to update primary MD,subspecialty MD or APP then REFRESH:1}    History of Present Illness Joann Anderson is a 89 y.o. female with past medical history of CKD stage III, hypertension, hyperlipidemia and DM2 who was recently admitted to the hospital on 04/26/2024 with left-sided numbness, left facial droop and left-sided neglect that started early that morning.  She was diagnosed with a right MCA small scattered infarct with occlusion treated with TNK.  She underwent reperfusion procedure by interventional radiology service.  Echocardiogram showed EF 60 to 65%.  Hemoglobin A1c 5.8.  She was placed on aspirin  and Plavix  with recommendation of outpatient 30-day heart monitor.  She presents today to review the heart monitor.  However she was unable to wear it.  ROS: ***  Studies Reviewed      *** Risk Assessment/Calculations {Does this patient have ATRIAL FIBRILLATION?:[phone]} No BP recorded.  {Refresh Note OR Click here to enter BP  :1}***       Physical Exam VS:  There were no vitals taken for this visit.       Wt Readings from Last 3 Encounters:  06/13/24 168 lb (76.2 kg)  04/26/24 172 lb 6.4 oz (78.2 kg)    GEN: Well nourished, well developed in no acute distress NECK: No JVD; No carotid bruits CARDIAC: ***RRR, no murmurs, rubs, gallops RESPIRATORY:  Clear to auscultation without rales, wheezing or rhonchi  ABDOMEN: Soft, non-tender, non-distended EXTREMITIES:  No edema; No deformity   ASSESSMENT AND PLAN ***    {Are you ordering a CV Procedure (e.g. stress test, cath, DCCV, TEE, etc)?   Press F2        :[id number]}  Dispo: ***  Signed, Scot Ford, PA  "

## 2024-06-25 NOTE — Telephone Encounter (Signed)
 Patient presented today to review the heart monitor, however she never wore the heart monitor.  Therefore today's visit has been canceled.  I did sit down and talk to the both the patient and  her husband.  They are aware the purpose of the heart monitor is to capture any A-fib or atrial flutter that could have precipitated the recent stroke and hopefully prevent another stroke from happening in the future.  She is aware that A-fib or atrial flutter may increase the chance of stroke by about 5 times higher than normal.  She understand the overall risk and decides not to wear the heart monitor.  I will cancel today's office visit and inform neurology service.

## 2024-06-25 NOTE — Telephone Encounter (Signed)
 Noted. Thank you for the update.

## 2024-08-02 ENCOUNTER — Telehealth: Payer: Self-pay

## 2024-08-02 NOTE — Patient Outreach (Signed)
 Telephone outreach to patient to obtain mRS was successfully completed. MRS= 0  Shereen Gin [facility] Assistant Direct Dial: [phone]  Fax: [phone] Website: [email or web address]
